# Patient Record
Sex: Female | Born: 1970 | Race: White | Hispanic: No | Marital: Single | Smoking: Former smoker
Health system: Southern US, Community
[De-identification: ages and names within clinical notes are randomized; demographics above are authoritative.]

## PROBLEM LIST (undated history)

## (undated) DIAGNOSIS — I10 Essential (primary) hypertension: Secondary | ICD-10-CM

## (undated) DIAGNOSIS — E785 Hyperlipidemia, unspecified: Secondary | ICD-10-CM

## (undated) DIAGNOSIS — K219 Gastro-esophageal reflux disease without esophagitis: Secondary | ICD-10-CM

## (undated) HISTORY — DX: Essential (primary) hypertension: I10

## (undated) HISTORY — DX: Gastro-esophageal reflux disease without esophagitis: K21.9

## (undated) HISTORY — DX: Hyperlipidemia, unspecified: E78.5

## (undated) HISTORY — PX: OTHER SURGICAL HISTORY: SHX169

---

## 2005-04-23 HISTORY — PX: HEMORROIDECTOMY: SUR656

## 2013-09-24 ENCOUNTER — Ambulatory Visit (INDEPENDENT_AMBULATORY_CARE_PROVIDER_SITE_OTHER): Payer: 59 | Admitting: Family

## 2013-09-24 ENCOUNTER — Encounter (INDEPENDENT_AMBULATORY_CARE_PROVIDER_SITE_OTHER): Payer: Self-pay

## 2013-09-24 ENCOUNTER — Encounter: Payer: Self-pay | Admitting: Family

## 2013-09-24 VITALS — BP 131/79 | HR 70 | Temp 98.6°F | Ht 66.0 in | Wt 259.0 lb

## 2013-09-24 DIAGNOSIS — Z13228 Encounter for screening for other metabolic disorders: Secondary | ICD-10-CM

## 2013-09-24 DIAGNOSIS — Z1329 Encounter for screening for other suspected endocrine disorder: Secondary | ICD-10-CM

## 2013-09-24 DIAGNOSIS — I10 Essential (primary) hypertension: Secondary | ICD-10-CM

## 2013-09-24 DIAGNOSIS — K219 Gastro-esophageal reflux disease without esophagitis: Secondary | ICD-10-CM | POA: Insufficient documentation

## 2013-09-24 DIAGNOSIS — E785 Hyperlipidemia, unspecified: Secondary | ICD-10-CM | POA: Insufficient documentation

## 2013-09-24 DIAGNOSIS — Z130 Encounter for screening for diseases of the blood and blood-forming organs and certain disorders involving the immune mechanism: Secondary | ICD-10-CM

## 2013-09-24 DIAGNOSIS — Z1321 Encounter for screening for nutritional disorder: Secondary | ICD-10-CM

## 2013-09-24 DIAGNOSIS — M25519 Pain in unspecified shoulder: Secondary | ICD-10-CM

## 2013-09-24 MED ORDER — PANTOPRAZOLE SODIUM 40 MG PO TBEC: 40.0000 mg | DELAYED_RELEASE_TABLET | Freq: Every day | ORAL | Status: DC

## 2013-09-24 MED ORDER — NAPROXEN 500 MG PO TABS: 500.0000 mg | ORAL_TABLET | Freq: Two times a day (BID) | ORAL | Status: DC

## 2013-09-24 MED ORDER — BUPROPION HCL ER (SR) 200 MG PO TB12: 200.0000 mg | ORAL_TABLET | Freq: Two times a day (BID) | ORAL | Status: DC

## 2013-09-24 MED ORDER — LOSARTAN POTASSIUM 50 MG PO TABS: 50.0000 mg | ORAL_TABLET | Freq: Every day | ORAL | Status: AC

## 2013-09-24 MED ORDER — LOVASTATIN 20 MG PO TABS: 20.0000 mg | ORAL_TABLET | Freq: Every day | ORAL | Status: AC

## 2013-09-24 NOTE — Progress Notes (Signed)
Subjective:    Patient ID: Penny Velasquez, female    DOB: [birth year], 43 y.o.   MRN: [id number]  Shoulder Pain  The pain is present in the right shoulder and left shoulder. The current episode started more than 1 month ago (About two months ago). There has been no history of extremity trauma. The problem occurs 2 to 4 times per day. The problem has been waxing and waning. The quality of the pain is described as aching. The pain is at a severity of 6/10. The pain is mild. Associated symptoms include numbness and tingling. Pertinent negatives include no inability to bear weight, joint swelling or limited range of motion. The symptoms are aggravated by activity. She has tried acetaminophen for the symptoms. The treatment provided mild relief. There is no history of diabetes, gout or osteoarthritis.  Hypertension This is a chronic problem. The current episode started more than 1 year ago. The problem has been resolved since onset. The problem is controlled. Pertinent negatives include no anxiety, blurred vision, chest pain, headaches, palpitations, peripheral edema or shortness of breath. Risk factors for coronary artery disease include dyslipidemia, sedentary lifestyle, obesity and family history. Past treatments include angiotensin blockers. The current treatment provides moderate improvement. There is no history of kidney disease, CAD/MI or a thyroid problem. There is no history of sleep apnea.  Hyperlipidemia This is a chronic problem. The current episode started more than 1 year ago. The problem is controlled. Recent lipid tests were reviewed and are normal. She has no history of diabetes or hypothyroidism. Pertinent negatives include no chest pain, leg pain, myalgias or shortness of breath. Current antihyperlipidemic treatment includes statins. The current treatment provides moderate improvement of lipids. Risk factors for coronary artery disease include dyslipidemia, family history, hypertension and  obesity.  Gastrophageal Reflux She reports no chest pain, no coughing, no heartburn or no nausea. This is a chronic problem. The current episode started more than 1 year ago. The problem occurs rarely. The problem has been resolved. The symptoms are aggravated by certain foods. Pertinent negatives include no fatigue. She has tried a PPI for the symptoms. The treatment provided significant relief.      Review of Systems  Constitutional: Negative for fatigue.  Eyes: Negative for blurred vision.  Respiratory: Negative.  Negative for apnea, cough and shortness of breath.   Cardiovascular: Negative for chest pain and palpitations.  Gastrointestinal: Negative for heartburn and nausea.  Genitourinary: Negative.   Musculoskeletal: Negative.  Negative for gout and myalgias.  Neurological: Positive for tingling and numbness. Negative for headaches.  All other systems reviewed and are negative.      Objective:   Physical Exam  Vitals reviewed. Constitutional: She is oriented to person, place, and time. She appears well-developed and well-nourished. No distress.  HENT:  Head: Normocephalic and atraumatic.  Right Ear: External ear normal.  Left Ear: External ear normal.  Mouth/Throat: Oropharynx is clear and moist.  Eyes: Pupils are equal, round, and reactive to light.  Neck: Normal range of motion. Neck supple. No thyromegaly present.  Cardiovascular: Normal rate, regular rhythm, normal heart sounds and intact distal pulses.   No murmur heard. Pulmonary/Chest: Effort normal and breath sounds normal. No respiratory distress. She has no wheezes.  Abdominal: Soft. Bowel sounds are normal. She exhibits no distension. There is no tenderness.  Musculoskeletal: Normal range of motion. She exhibits no edema and no tenderness.  Neurological: She is alert and oriented to person, place, and time. She has normal reflexes. No  cranial nerve deficit.  Skin: Skin is warm and dry.  Psychiatric: She has a  normal mood and affect. Her behavior is normal. Judgment and thought content normal.      BP 131/79  Pulse 70  Temp(Src) 98.6 F (37 C) (Oral)  Ht 5' 6"  (1.676 m)  Wt 259 lb (117.482 kg)  BMI 41.82 kg/m2  LMP 09/15/2013     Assessment & Plan:  1. Hyperlipidemia - lovastatin (MEVACOR) 20 MG tablet; Take 1 tablet (20 mg total) by mouth at bedtime.  Dispense: 30 tablet; Refill: 6 - Lipid panel  2. Hypertension - losartan (COZAAR) 50 MG tablet; Take 1 tablet (50 mg total) by mouth daily.  Dispense: 30 tablet; Refill: 6 - CMP14+EGFR  3. GERD (gastroesophageal reflux disease) - pantoprazole (PROTONIX) 40 MG tablet; Take 1 tablet (40 mg total) by mouth daily.  Dispense: 30 tablet; Refill: 6  4. Encounter for vitamin deficiency screening - Vit D  25 hydroxy (rtn osteoporosis monitoring)  5. Pain in joint, shoulder region -Take medication with food -Take continuous for next 5-7 days -rest -Ice/Heat if helps - naproxen (NAPROSYN) 500 MG tablet; Take 1 tablet (500 mg total) by mouth 2 (two) times daily with a meal.  Dispense: 30 tablet; Refill: 3   Continue all meds Labs pending Health Maintenance reviewed Diet and exercise encouraged RTO 6 months  Evelina Dun, FNP   Evelina Dun, FNP

## 2013-09-24 NOTE — Patient Instructions (Signed)
   Shoulder Pain The shoulder is the joint that connects your arms to your body. The bones that form the shoulder joint include the upper arm bone (humerus), the shoulder blade (scapula), and the collarbone (clavicle). The top of the humerus is shaped like a ball and fits into a rather flat socket on the scapula (glenoid cavity). A combination of muscles and strong, fibrous tissues that connect muscles to bones (tendons) support your shoulder joint and hold the ball in the socket. Small, fluid-filled sacs (bursae) are located in different areas of the joint. They act as cushions between the bones and the overlying soft tissues and help reduce friction between the gliding tendons and the bone as you move your arm. Your shoulder joint allows a wide range of motion in your arm. This range of motion allows you to do things like scratch your back or throw a ball. However, this range of motion also makes your shoulder more prone to pain from overuse and injury. Causes of shoulder pain can originate from both injury and overuse and usually can be grouped in the following four categories:  Redness, swelling, and pain (inflammation) of the tendon (tendinitis) or the bursae (bursitis).  Instability, such as a dislocation of the joint.  Inflammation of the joint (arthritis).  Broken bone (fracture). HOME CARE INSTRUCTIONS   Apply ice to the sore area.  Put ice in a plastic bag.  Place a towel between your skin and the bag.  Leave the ice on for 15-20 minutes, 03-04 times per day for the first 2 days.  Stop using cold packs if they do not help with the pain.  If you have a shoulder sling or immobilizer, wear it as long as your caregiver instructs. Only remove it to shower or bathe. Move your arm as little as possible, but keep your hand moving to prevent swelling.  Squeeze a soft ball or foam pad as much as possible to help prevent swelling.  Only take over-the-counter or prescription medicines for  pain, discomfort, or fever as directed by your caregiver. SEEK MEDICAL CARE IF:   Your shoulder pain increases, or new pain develops in your arm, hand, or fingers.  Your hand or fingers become cold and numb.  Your pain is not relieved with medicines. SEEK IMMEDIATE MEDICAL CARE IF:   Your arm, hand, or fingers are numb or tingling.  Your arm, hand, or fingers are significantly swollen or turn white or blue. MAKE SURE YOU:   Understand these instructions.  Will watch your condition.  Will get help right away if you are not doing well or get worse. Document Released: 01/17/2005 Document Revised: 01/02/2012 Document Reviewed: 03/24/2011 Exit[employer] Patient Information 2014 [employer].  

## 2013-09-25 ENCOUNTER — Other Ambulatory Visit: Payer: Self-pay | Admitting: Family

## 2013-09-25 ENCOUNTER — Telehealth: Payer: Self-pay | Admitting: Family Medicine

## 2013-09-25 LAB — CMP14+EGFR
ALK PHOS: 74 IU/L (ref 39–117)
ALT: 20 IU/L (ref 0–32)
AST: 14 IU/L (ref 0–40)
Albumin/Globulin Ratio: 2.3 (ref 1.1–2.5)
Albumin: 4.2 g/dL (ref 3.5–5.5)
BILIRUBIN TOTAL: 0.4 mg/dL (ref 0.0–1.2)
BUN/Creatinine Ratio: 10 (ref 9–23)
BUN: 10 mg/dL (ref 6–24)
CHLORIDE: 101 mmol/L (ref 97–108)
CO2: 25 mmol/L (ref 18–29)
Calcium: 9.3 mg/dL (ref 8.7–10.2)
Creatinine, Ser: 1.02 mg/dL — ABNORMAL HIGH (ref 0.57–1.00)
GFR calc Af Amer: 79 mL/min/{1.73_m2} (ref >59)
GFR calc non Af Amer: 68 mL/min/{1.73_m2} (ref >59)
Globulin, Total: 1.8 g/dL (ref 1.5–4.5)
Glucose: 89 mg/dL (ref 65–99)
POTASSIUM: 4.4 mmol/L (ref 3.5–5.2)
Sodium: 139 mmol/L (ref 134–144)
Total Protein: 6 g/dL (ref 6.0–8.5)

## 2013-09-25 LAB — LIPID PANEL
CHOLESTEROL TOTAL: 180 mg/dL (ref 100–199)
Chol/HDL Ratio: 4 ratio units (ref 0.0–4.4)
HDL: 45 mg/dL (ref >39)
LDL CALC: 95 mg/dL (ref 0–99)
Triglycerides: 198 mg/dL — ABNORMAL HIGH (ref 0–149)
VLDL Cholesterol Cal: 40 mg/dL (ref 5–40)

## 2013-09-25 LAB — VITAMIN D 25 HYDROXY (VIT D DEFICIENCY, FRACTURES): VIT D 25 HYDROXY: 20.2 ng/mL — AB (ref 30.0–100.0)

## 2013-09-25 NOTE — Telephone Encounter (Signed)
Patient aware.

## 2013-09-25 NOTE — Telephone Encounter (Signed)
Message copied by Azalee Course on Fri Sep 25, 2013 11:16 AM ------      Message from: [place], [place] A      Created: Fri Sep 25, 2013  9:57 AM       Kidney and liver function stable      Cholesterol levels WNL- Triglycerides are high- Need to be on low fat diet-If they stay elevated will need to increase medication      Vit D levels low- Need to start on Vit D OTC ------

## 2013-10-09 ENCOUNTER — Telehealth: Payer: Self-pay | Admitting: Family

## 2013-10-09 DIAGNOSIS — K219 Gastro-esophageal reflux disease without esophagitis: Secondary | ICD-10-CM

## 2013-10-12 MED ORDER — PANTOPRAZOLE SODIUM 40 MG PO TBEC: 40.0000 mg | DELAYED_RELEASE_TABLET | Freq: Every day | ORAL | Status: DC

## 2013-10-12 MED ORDER — MELOXICAM 15 MG PO TABS: 15.0000 mg | ORAL_TABLET | Freq: Every day | ORAL | Status: DC

## 2013-10-12 NOTE — Telephone Encounter (Signed)
[facility]. Stop taking naproxen and new rx sent of mobic 15 mg daily. If does not help will need to be reseen.

## 2013-10-13 NOTE — Telephone Encounter (Signed)
Patient aware.

## 2013-11-25 ENCOUNTER — Encounter: Payer: Self-pay | Admitting: Family Medicine

## 2013-11-25 ENCOUNTER — Ambulatory Visit (INDEPENDENT_AMBULATORY_CARE_PROVIDER_SITE_OTHER): Payer: 59 | Admitting: Family Medicine

## 2013-11-25 VITALS — BP 127/88 | HR 74 | Temp 98.4°F | Ht 66.0 in | Wt 263.0 lb

## 2013-11-25 DIAGNOSIS — R6884 Jaw pain: Secondary | ICD-10-CM

## 2013-11-25 DIAGNOSIS — N912 Amenorrhea, unspecified: Secondary | ICD-10-CM

## 2013-11-25 MED ORDER — AZITHROMYCIN 250 MG PO TABS: ORAL_TABLET | ORAL | Status: AC

## 2013-11-25 MED ORDER — LABETALOL HCL 100 MG PO TABS: 100.0000 mg | ORAL_TABLET | Freq: Two times a day (BID) | ORAL | Status: DC

## 2013-11-25 MED ORDER — OXYCODONE HCL 5 MG PO TABS: 5.0000 mg | ORAL_TABLET | ORAL | Status: AC | PRN

## 2013-11-25 NOTE — Progress Notes (Signed)
   Subjective:    Patient ID: Penny Velasquez, female    DOB: [birth year], 43 y.o.   MRN: [id number]  HPI 43 year old female with left lower jaw pain. [facility] and given hydrocodone. She had a negative urine pregnancy test but she is trying to conceive. Of note she is on several other medicines including Wellbutrin, Losartin, and pantoprazole. No x-rays were done because of the possibility of pregnancy. There is a history of ovulation 8 days ago.    Review of Systems  Constitutional: Negative.   HENT: Negative.   Eyes: Negative.   Respiratory: Negative.   Cardiovascular: Negative.   Gastrointestinal: Negative.   Endocrine: Negative.   Genitourinary: Negative.   Musculoskeletal: Positive for myalgias.       Bilateral thumb pain  Skin: Rash: not true rash but healing intertrigo.  Hematological: Negative.   Psychiatric/Behavioral: Negative.        Objective:   Physical Exam  Constitutional: She is oriented to person, place, and time. She appears well-developed and well-nourished.  Pain in left side mandible with pain on persussion of 1or 2nd molar  Eyes: Conjunctivae and EOM are normal.  Neck: Normal range of motion. Neck supple.  Cardiovascular: Normal rate, regular rhythm and normal heart sounds.   Pulmonary/Chest: Effort normal and breath sounds normal.  Abdominal: Soft. Bowel sounds are normal.  Musculoskeletal: Normal range of motion.  Neurological: She is alert and oriented to person, place, and time. She has normal reflexes.  Skin: Skin is warm and dry.  Psychiatric: She has a normal mood and affect. Her behavior is normal. Thought content normal.          Assessment & Plan:  1. Pain in bone of jaw Likely tooth abscess.  Will get serum preg, qualitative and if negative, x-ray  2. Amenorrhea As above.  After review of her meds will change due to desire to get pregnant.  D/C Losartan and begin labetalol.  Add Zithromax for  possible abcess.  Taper wellbutrin.  Also change oxycodone for hydrocodone due to class b vs c in pregnancy  Frederica KusterStephen M Miller MD - hCG, serum, qualitative

## 2013-11-26 ENCOUNTER — Telehealth: Payer: Self-pay | Admitting: Family Medicine

## 2013-11-26 LAB — HCG, SERUM, QUALITATIVE: hCG,Beta Subunit,Qual,Serum: NEGATIVE m[IU]/mL (ref <6)

## 2013-11-26 NOTE — Telephone Encounter (Signed)
Advised that she should continue the antibiotics and follow up with a dentist. She is unable to pay for a dentist until next week.  Explained that we are very limited in what we can do to treat dental problems. She is reluctant to take any anti-inflammatories since she is trying to conceive. She is taking the pain medication but states it isn't helping a lot. Advised to apply ice to help with swelling and that I will forward this message to Dr. Hyacinth MeekerMiller for review. She is aware that he is out of the office until tomorrow and that she may not receive a call back until then.  Once again strongly encouraged her to contact a dentist about this problem. Also explained that a change in antibiotics would not produce an immediate effect.

## 2013-11-27 ENCOUNTER — Telehealth: Payer: Self-pay | Admitting: Family Medicine

## 2013-11-27 NOTE — Telephone Encounter (Signed)
Message copied by Azalee CourseFULP, Jayton Popelka on Fri Nov 27, 2013  9:22 AM ------      Message from: Frederica KusterMILLER, STEPHEN M      Created: Fri Nov 27, 2013  7:56 AM       Pt is not pregnant ------

## 2013-11-27 NOTE — Telephone Encounter (Signed)
Agree with advice by Baxter HireKristen; nothing stronger for pain and continue zithromax

## 2013-12-03 ENCOUNTER — Encounter: Payer: Self-pay | Admitting: *Deleted

## 2014-01-07 ENCOUNTER — Telehealth: Payer: Self-pay | Admitting: Family Medicine

## 2014-01-07 MED ORDER — LABETALOL HCL 100 MG PO TABS: 100.0000 mg | ORAL_TABLET | Freq: Two times a day (BID) | ORAL | Status: DC

## 2014-01-08 ENCOUNTER — Telehealth: Payer: Self-pay | Admitting: Family Medicine

## 2014-02-25 ENCOUNTER — Other Ambulatory Visit: Payer: Self-pay | Admitting: Family

## 2014-03-24 ENCOUNTER — Telehealth: Payer: Self-pay | Admitting: Family Medicine

## 2014-03-24 NOTE — Telephone Encounter (Signed)
Patient offered a 4:30 appointment but she was unable to take that appointment.

## 2014-03-24 NOTE — Telephone Encounter (Signed)
Patient aware we have no available appointments today and offered her an appointment for tomorrow but patient could not come tomorrow.

## 2014-03-29 ENCOUNTER — Ambulatory Visit: Payer: 59 | Admitting: Family

## 2014-03-30 ENCOUNTER — Ambulatory Visit: Payer: 59 | Admitting: Family Medicine

## 2014-04-30 ENCOUNTER — Other Ambulatory Visit: Payer: Self-pay | Admitting: Family

## 2014-05-21 ENCOUNTER — Ambulatory Visit: Payer: 59 | Admitting: Family

## 2014-12-18 ENCOUNTER — Encounter (HOSPITAL_COMMUNITY): Payer: Self-pay | Admitting: Emergency Medicine

## 2014-12-18 ENCOUNTER — Emergency Department (HOSPITAL_COMMUNITY): Payer: BLUE CROSS/BLUE SHIELD

## 2014-12-18 ENCOUNTER — Emergency Department (HOSPITAL_COMMUNITY)
Admission: EM | Admit: 2014-12-18 | Discharge: 2014-12-18 | Disposition: A | Discharge status: Home / Self Care | Payer: BLUE CROSS/BLUE SHIELD | Attending: Emergency Medicine | Admitting: Emergency Medicine

## 2014-12-18 DIAGNOSIS — R079 Chest pain, unspecified: Secondary | ICD-10-CM | POA: Insufficient documentation

## 2014-12-18 DIAGNOSIS — K219 Gastro-esophageal reflux disease without esophagitis: Secondary | ICD-10-CM | POA: Diagnosis not present

## 2014-12-18 DIAGNOSIS — Z880 Allergy status to penicillin: Secondary | ICD-10-CM | POA: Insufficient documentation

## 2014-12-18 DIAGNOSIS — I10 Essential (primary) hypertension: Secondary | ICD-10-CM | POA: Diagnosis not present

## 2014-12-18 DIAGNOSIS — E785 Hyperlipidemia, unspecified: Secondary | ICD-10-CM | POA: Insufficient documentation

## 2014-12-18 DIAGNOSIS — Z79899 Other long term (current) drug therapy: Secondary | ICD-10-CM | POA: Diagnosis not present

## 2014-12-18 LAB — CBC
HEMATOCRIT: 37 % (ref 36.0–46.0)
Hemoglobin: 13 g/dL (ref 12.0–15.0)
MCH: 32.2 pg (ref 26.0–34.0)
MCHC: 35.1 g/dL (ref 30.0–36.0)
MCV: 91.6 fL (ref 78.0–100.0)
Platelets: 333 10*3/uL (ref 150–400)
RBC: 4.04 MIL/uL (ref 3.87–5.11)
RDW: 12.2 % (ref 11.5–15.5)
WBC: 9.2 10*3/uL (ref 4.0–10.5)

## 2014-12-18 LAB — COMPREHENSIVE METABOLIC PANEL
ALBUMIN: 3.8 g/dL (ref 3.5–5.0)
ALT: 24 U/L (ref 14–54)
AST: 19 U/L (ref 15–41)
Alkaline Phosphatase: 72 U/L (ref 38–126)
Anion gap: 6 (ref 5–15)
BILIRUBIN TOTAL: 0.4 mg/dL (ref 0.3–1.2)
BUN: 9 mg/dL (ref 6–20)
CHLORIDE: 103 mmol/L (ref 101–111)
CO2: 27 mmol/L (ref 22–32)
Calcium: 9.1 mg/dL (ref 8.9–10.3)
Creatinine, Ser: 0.92 mg/dL (ref 0.44–1.00)
GFR calc Af Amer: >60 mL/min (ref >60)
GFR calc non Af Amer: >60 mL/min (ref >60)
GLUCOSE: 88 mg/dL (ref 65–99)
POTASSIUM: 4 mmol/L (ref 3.5–5.1)
SODIUM: 136 mmol/L (ref 135–145)
Total Protein: 6.6 g/dL (ref 6.5–8.1)

## 2014-12-18 LAB — TROPONIN I

## 2014-12-18 NOTE — Discharge Instructions (Signed)
Chest Pain (Nonspecific) °It is often hard to give a specific diagnosis for the cause of chest pain. There is always a chance that your pain could be related to something serious, such as a heart attack or a blood clot in the lungs. You need to follow up with your health care provider for further evaluation. °CAUSES  °· Heartburn. °· Pneumonia or bronchitis. °· Anxiety or stress. °· Inflammation around your heart (pericarditis) or lung (pleuritis or pleurisy). °· A blood clot in the lung. °· A collapsed lung (pneumothorax). It can develop suddenly on its own (spontaneous pneumothorax) or from trauma to the chest. °· Shingles infection (herpes zoster virus). °The chest wall is composed of bones, muscles, and cartilage. Any of these can be the source of the pain. °· The bones can be bruised by injury. °· The muscles or cartilage can be strained by coughing or overwork. °· The cartilage can be affected by inflammation and become sore (costochondritis). °DIAGNOSIS  °Lab tests or other studies may be needed to find the cause of your pain. Your health care provider may have you take a test called an ambulatory electrocardiogram (ECG). An ECG records your heartbeat patterns over a 24-hour period. You may also have other tests, such as: °· Transthoracic echocardiogram (TTE). During echocardiography, sound waves are used to evaluate how blood flows through your heart. °· Transesophageal echocardiogram (TEE). °· Cardiac monitoring. This allows your health care provider to monitor your heart rate and rhythm in real time. °· Holter monitor. This is a portable device that records your heartbeat and can help diagnose heart arrhythmias. It allows your health care provider to track your heart activity for several days, if needed. °· Stress tests by exercise or by giving medicine that makes the heart beat faster. °TREATMENT  °· Treatment depends on what may be causing your chest pain. Treatment may include: °· Acid blockers for  heartburn. °· Anti-inflammatory medicine. °· Pain medicine for inflammatory conditions. °· Antibiotics if an infection is present. °· You may be advised to change lifestyle habits. This includes stopping smoking and avoiding alcohol, caffeine, and chocolate. °· You may be advised to keep your head raised (elevated) when sleeping. This reduces the chance of acid going backward from your stomach into your esophagus. °Most of the time, nonspecific chest pain will improve within 2-3 days with rest and mild pain medicine.  °HOME CARE INSTRUCTIONS  °· If antibiotics were prescribed, take them as directed. Finish them even if you start to feel better. °· For the next few days, avoid physical activities that bring on chest pain. Continue physical activities as directed. °· Do not use any tobacco products, including cigarettes, chewing tobacco, or electronic cigarettes. °· Avoid drinking alcohol. °· Only take medicine as directed by your health care provider. °· Follow your health care provider's suggestions for further testing if your chest pain does not go away. °· Keep any follow-up appointments you made. If you do not go to an appointment, you could develop lasting (chronic) problems with pain. If there is any problem keeping an appointment, call to reschedule. °SEEK MEDICAL CARE IF:  °· Your chest pain does not go away, even after treatment. °· You have a rash with blisters on your chest. °· You have a fever. °SEEK IMMEDIATE MEDICAL CARE IF:  °· You have increased chest pain or pain that spreads to your arm, neck, jaw, back, or abdomen. °· You have shortness of breath. °· You have an increasing cough, or you cough   up blood. °· You have severe back or abdominal pain. °· You feel nauseous or vomit. °· You have severe weakness. °· You faint. °· You have chills. °This is an emergency. Do not wait to see if the pain will go away. Get medical help at once. Call your local emergency services (911 in [place] Do not drive  yourself to the hospital. °MAKE SURE YOU:  °· Understand these instructions. °· Will watch your condition. °· Will get help right away if you are not doing well or get worse. °Document Released: 01/17/2005 Document Revised: 04/14/2013 Document Reviewed: 11/13/2007 °ExitCare® Patient Information ©2015 [employer]. This information is not intended to replace advice given to you by your health care provider. Make sure you discuss any questions you have with your health care provider. ° °Hypertension °Hypertension, commonly called high blood pressure, is when the force of blood pumping through your arteries is too strong. Your arteries are the blood vessels that carry blood from your heart throughout your body. A blood pressure reading consists of a higher number over a lower number, such as 110/72. The higher number (systolic) is the pressure inside your arteries when your heart pumps. The lower number (diastolic) is the pressure inside your arteries when your heart relaxes. Ideally you want your blood pressure below 120/80. °Hypertension forces your heart to work harder to pump blood. Your arteries may become narrow or stiff. Having hypertension puts you at risk for heart disease, stroke, and other problems.  °RISK FACTORS °Some risk factors for high blood pressure are controllable. Others are not.  °Risk factors you cannot control include:  °· Race. You may be at higher risk if you are African American. °· Age. Risk increases with age. °· Gender. Men are at higher risk than women before age 45 years. After age 65, women are at higher risk than men. °Risk factors you can control include: °· Not getting enough exercise or physical activity. °· Being overweight. °· Getting too much fat, sugar, calories, or salt in your diet. °· Drinking too much alcohol. °SIGNS AND SYMPTOMS °Hypertension does not usually cause signs or symptoms. Extremely high blood pressure (hypertensive crisis) may cause headache, anxiety, shortness  of breath, and nosebleed. °DIAGNOSIS  °To check if you have hypertension, your health care provider will measure your blood pressure while you are seated, with your arm held at the level of your heart. It should be measured at least twice using the same arm. Certain conditions can cause a difference in blood pressure between your right and left arms. A blood pressure reading that is higher than normal on one occasion does not mean that you need treatment. If one blood pressure reading is high, ask your health care provider about having it checked again. °TREATMENT  °Treating high blood pressure includes making lifestyle changes and possibly taking medicine. Living a healthy lifestyle can help lower high blood pressure. You may need to change some of your habits. °Lifestyle changes may include: °· Following the DASH diet. This diet is high in fruits, vegetables, and whole grains. It is low in salt, red meat, and added sugars. °· Getting at least 2½ hours of brisk physical activity every week. °· Losing weight if necessary. °· Not smoking. °· Limiting alcoholic beverages. °· Learning ways to reduce stress. ° If lifestyle changes are not enough to get your blood pressure under control, your health care provider may prescribe medicine. You may need to take more than one. Work closely with your health care provider   to understand the risks and benefits. °HOME CARE INSTRUCTIONS °· Have your blood pressure rechecked as directed by your health care provider.   °· Take medicines only as directed by your health care provider. Follow the directions carefully. Blood pressure medicines must be taken as prescribed. The medicine does not work as well when you skip doses. Skipping doses also puts you at risk for problems.   °· Do not smoke.   °· Monitor your blood pressure at home as directed by your health care provider.  °SEEK MEDICAL CARE IF:  °· You think you are having a reaction to medicines taken. °· You have recurrent  headaches or feel dizzy. °· You have swelling in your ankles. °· You have trouble with your vision. °SEEK IMMEDIATE MEDICAL CARE IF: °· You develop a severe headache or confusion. °· You have unusual weakness, numbness, or feel faint. °· You have severe chest or abdominal pain. °· You vomit repeatedly. °· You have trouble breathing. °MAKE SURE YOU:  °· Understand these instructions. °· Will watch your condition. °· Will get help right away if you are not doing well or get worse. °Document Released: 04/09/2005 Document Revised: 08/24/2013 Document Reviewed: 01/30/2013 °ExitCare® Patient Information ©2015 [employer]. This information is not intended to replace advice given to you by your health care provider. Make sure you discuss any questions you have with your health care provider. ° °

## 2014-12-18 NOTE — ED Provider Notes (Signed)
CSN: [id number]     Arrival date & time 12/18/14  1912 History   First MD Initiated Contact with Patient 12/18/14 1944     Chief Complaint  Patient presents with  . Chest Pain     (Consider location/radiation/quality/duration/timing/severity/associated sxs/prior Treatment) HPI Comments: Patient presents to the ER for evaluation of chest pain and elevated blood pressure. Patient reports that she was experiencing some pressure in the left side of her chest earlier today. This has resolved. She checked her blood pressure because she recently had stopped taking her labetalol, blood pressure was markedly elevated. She presented to the ER for evaluation. There is no headache or blurred vision. No shortness of breath.  Patient is a 44 y.o. female presenting with chest pain.  Chest Pain   Past Medical History  Diagnosis Date  . GERD (gastroesophageal reflux disease)   . Hyperlipidemia   . Hypertension    Past Surgical History  Procedure Laterality Date  . Hemorroidectomy  2007  . Laprascopic abdominal surgery      exploratory   Family History  Problem Relation Age of Onset  . Anemia Mother   . Hypertension Mother   . Hypertension Father   . Heart disease Father   . Cancer Father     prostate   Social History  Substance Use Topics  . Smoking status: Former Smoker    Types: Cigarettes    Quit date: 09/25/2006  . Smokeless tobacco: Never Used  . Alcohol Use: No   OB History    No data available     Review of Systems  Cardiovascular: Positive for chest pain.  All other systems reviewed and are negative.     Allergies  Penicillins  Home Medications   Prior to Admission medications   Medication Sig Start Date End Date Taking? Authorizing Provider  azithromycin (ZITHROMAX) 250 MG tablet Take two tabs 1st day, then one q day x 4 11/25/13   Frederica Kuster, MD  buPROPion [facility] SR) 200 MG 12 hr tablet Take 1 tablet (200 mg total) by mouth 2 (two) times daily. 09/24/13    Junie Spencer, FNP  labetalol (NORMODYNE) 100 MG tablet TAKE ONE TABLET BY MOUTH TWICE DAILY 02/26/14   Frederica Kuster, MD  losartan (COZAAR) 50 MG tablet Take 1 tablet (50 mg total) by mouth daily. 09/24/13   Junie Spencer, FNP  lovastatin (MEVACOR) 20 MG tablet Take 1 tablet (20 mg total) by mouth at bedtime. 09/24/13   Junie Spencer, FNP  Omega-3 Fatty Acids (FISH OIL CONCENTRATE PO) Take by mouth.    Historical Provider, MD  oxyCODONE (OXY IR/ROXICODONE) 5 MG immediate release tablet Take 1 tablet (5 mg total) by mouth every 4 (four) hours as needed for severe pain. 11/25/13   Frederica Kuster, MD  pantoprazole (PROTONIX) 40 MG tablet TAKE ONE TABLET BY MOUTH ONCE DAILY 05/03/14   Frederica Kuster, MD  Prenatal Multivit-Min-Fe-FA (PRENATAL VITAMINS PO) Take by mouth.    Historical Provider, MD   BP 163/100 mmHg  Pulse 59  Temp(Src) 98.1 F (36.7 C) (Oral)  Resp 20  Ht 5' 6.75" (1.695 m)  Wt 250 lb (113.399 kg)  BMI 39.47 kg/m2  SpO2 99%  LMP 12/06/2014 Physical Exam  Constitutional: She is oriented to person, place, and time. She appears well-developed and well-nourished. No distress.  HENT:  Head: Normocephalic and atraumatic.  Right Ear: Hearing normal.  Left Ear: Hearing normal.  Nose: Nose normal.  Mouth/Throat: Oropharynx  is clear and moist and mucous membranes are normal.  Eyes: Conjunctivae and EOM are normal. Pupils are equal, round, and reactive to light.  Neck: Normal range of motion. Neck supple.  Cardiovascular: Regular rhythm, S1 normal and S2 normal.  Exam reveals no gallop and no friction rub.   No murmur heard. Pulmonary/Chest: Effort normal and breath sounds normal. No respiratory distress. She exhibits no tenderness.  Abdominal: Soft. Normal appearance and bowel sounds are normal. There is no hepatosplenomegaly. There is no tenderness. There is no rebound, no guarding, no tenderness at McBurney's point and negative Murphy's sign. No hernia.  Musculoskeletal:  Normal range of motion.  Neurological: She is alert and oriented to person, place, and time. She has normal strength. No cranial nerve deficit or sensory deficit. Coordination normal. GCS eye subscore is 4. GCS verbal subscore is 5. GCS motor subscore is 6.  Skin: Skin is warm, dry and intact. No rash noted. No cyanosis.  Psychiatric: She has a normal mood and affect. Her speech is normal and behavior is normal. Thought content normal.  Nursing note and vitals reviewed.   ED Course  Procedures (including critical care time) Labs Review Labs Reviewed  CBC  COMPREHENSIVE METABOLIC PANEL  TROPONIN I    Imaging Review No results found. I have personally reviewed and evaluated these images and lab results as part of my medical decision-making.   EKG Interpretation   Date/Time:  Saturday December 18 2014 19:33:10 EDT Ventricular Rate:  60 PR Interval:  151 QRS Duration: 81 QT Interval:  412 QTC Calculation: 412 R Axis:   27 Text Interpretation:  Sinus rhythm Low voltage, precordial leads No  previous tracing Confirmed by POLLINA  MD, CHRISTOPHER ([id number]) on  12/18/2014 8:01:39 PM      MDM   Final diagnoses:  None  hypertension  Patient presents to the ER for evaluation of chest pain and hypertension. Patient had a brief episode of tightness in her chest that spontaneously resolved. She checked her blood pressure and it was elevated. She checked it again it was even higher. She did present to the ER with a blood pressure of 210/87. It has slowly come down and she has now essentially normotensive. Patient has been off of her labetalol but recently restarted. It is recommended she takes her clonidine and labetalol for several weeks before she makes any decisions on blood pressure medication changes. Her EKG and troponin are negative. Patient is felt to be low risk, discharged outpatient management.    Gilda Crease, MD 12/18/14 [phone]

## 2016-05-22 ENCOUNTER — Ambulatory Visit: Payer: BLUE CROSS/BLUE SHIELD | Attending: Family Medicine | Admitting: Physical Therapy

## 2016-05-22 DIAGNOSIS — M5441 Lumbago with sciatica, right side: Secondary | ICD-10-CM | POA: Insufficient documentation

## 2016-05-22 NOTE — Patient Instructions (Signed)
Trigger Point Dry Needling  . What is Trigger Point Dry Needling (DN)? o DN is a physical therapy technique used to treat muscle pain and dysfunction. Specifically, DN helps deactivate muscle trigger points (muscle knots).  o A thin filiform needle is used to penetrate the skin and stimulate the underlying trigger point. The goal is for a local twitch response (LTR) to occur and for the trigger point to relax. No medication of any kind is injected during the procedure.   . What Does Trigger Point Dry Needling Feel Like?  o The procedure feels different for each individual patient. Some patients report that they do not actually feel the needle enter the skin and overall the process is not painful. Very mild bleeding may occur. However, many patients feel a deep cramping in the muscle in which the needle was inserted. This is the local twitch response.   Marland Kitchen. How Will I feel after the treatment? o Soreness is normal, and the onset of soreness may not occur for a few hours. Typically this soreness does not last longer than two days.  o Bruising is uncommon, however; ice can be used to decrease any possible bruising.  o In rare cases feeling tired or nauseous after the treatment is normal. In addition, your symptoms may get worse before they get better, this period will typically not last longer than 24 hours.   . What Can I do After My Treatment? o Increase your hydration by drinking more water for the next 24 hours. o You may place ice or heat on the areas treated that have become sore, however, do not use heat on inflamed or bruised areas. Heat often brings more relief post needling. o You can continue your regular activities, but vigorous activity is not recommended initially after the treatment for 24 hours. o DN is best combined with other physical therapy such as strengthening, stretching, and other therapies.    Precautions:  In some cases, dry needling is done over the lung field. While rare,  there is a risk of pneumothorax (punctured lung). Because of this, if you ever experience shortness of breath on exertion, difficulty taking a deep breath, chest pain or a dry cough following dry needling, you should report to an emergency room and tell them that you have been dry needled over the thorax.  Solon PalmJulie Elsie Baynes, PT 05/22/16 1:29 PM [facility] [address] [place], [place], [address] Phone: [phone]   Fax:  [phone]

## 2016-05-22 NOTE — Therapy (Signed)
[facility] [address] [place], [place], [address] Phone: [phone]   Fax:  [phone]  Physical Therapy Evaluation  Patient Details  Name: Penny Velasquez MRN: [id number] Date of Birth: [birth year] Referring Provider: Kathlen Brunswick  Encounter Date: 05/22/2016      PT End of Session - 05/22/16 1305    Visit Number 1   Number of Visits 12   Date for PT Re-Evaluation 07/03/16   PT Start Time 1305   PT Stop Time 1356   PT Time Calculation (min) 51 min   Activity Tolerance Patient tolerated treatment well   Behavior During Therapy [facility] for tasks assessed/performed      Past Medical History:  Diagnosis Date  . GERD (gastroesophageal reflux disease)   . Hyperlipidemia   . Hypertension     Past Surgical History:  Procedure Laterality Date  . HEMORROIDECTOMY  2007  . Laprascopic abdominal surgery     exploratory    There were no vitals filed for this visit.       Subjective Assessment - 05/22/16 1301    Subjective Patint has a h/o LBP since 2004 when she hurt her back at work. Her most recent episode began 05/07/16. She has intermittent pain in R low back to ankle. Sometimes feels like R hip is going to dislocate when walking. Patient denies N/T.   Pertinent History HTN, twisted sacrum. ALLERGY: PENICILLEN   Limitations Sitting;Standing   How long can you sit comfortably? 30 min   How long can you stand comfortably? 20 min   Diagnostic tests xray shows lumbar arthritis   Patient Stated Goals decrease pain and increase movement   Currently in Pain? Yes   Pain Score 6    Pain Location Back   Pain Orientation Right;Lower   Pain Descriptors / Indicators Aching;Burning   Pain Type Acute pain   Pain Radiating Towards to R ankle   Pain Onset 1 to 4 weeks ago   Pain Frequency Intermittent   Aggravating Factors  prolonged walking and bending   Pain Relieving Factors rest, heat, NSAID   Effect of Pain on Daily Activities  limited with sitting and standing            [facility] PT Assessment - 05/22/16 0001      Assessment   Medical Diagnosis Acute right sided LBP with sciatica   Referring Provider Kathlen Brunswick   Onset Date/Surgical Date 05/07/16   Next MD Visit 05/24/16     Precautions   Precautions None     Balance Screen   Has the patient fallen in the past 6 months No   Has the patient had a decrease in activity level because of a fear of falling?  No   Is the patient reluctant to leave their home because of a fear of falling?  No     Prior Function   Level of Independence Independent   Vocation On disability     Posture/Postural Control   Posture Comments tight R QL, decreased lumbar lordosis     ROM / Strength   AROM / PROM / Strength AROM;Strength     AROM   Overall AROM Comments Lumbar and B hips WNL     Strength   Overall Strength Comments R hip flex 4+/5, else B hips 5/5; R knee ext 4/5; else B knees 5/5.      Flexibility   Soft Tissue Assessment /Muscle Length --  tight R gluteals, PF     Palpation  Spinal mobility WNL   Palpation comment even pelvic landmarks; multiple TPs in R gluteals and piriformis     Special Tests    Special Tests --  Negative SLR B; negative SIJ compression and instability                   [facility] Adult PT Treatment/Exercise - 05/22/16 0001      Manual Therapy   Manual Therapy Soft tissue mobilization   Soft tissue mobilization to R gluteals/piriformis and R lumbar          Trigger Point Dry Needling - 05/22/16 1425    Consent Given? Yes   Education Handout Provided Yes   Muscles Treated Upper Body Quadratus Lumborum   Muscles Treated Lower Body Gluteus minimus;Gluteus maximus  R glut med (not max)   Gluteus Maximus Response Twitch response elicited;Palpable increased muscle length   Gluteus Minimus Response Twitch response elicited;Palpable increased muscle length              PT Education - 05/22/16 1424     Education provided Yes   Education Details HEP; dry needling education and aftercare.   Person(s) Educated Patient   Methods Explanation;Handout   Comprehension Verbalized understanding             PT Long Term Goals - 05/22/16 1439      PT LONG TERM GOAL #1   Title I with HEP   Baseline     Time 6   Period Weeks   Status New     PT LONG TERM GOAL #2   Title Patient to be able to perform ADLS with 1-2/10 pain or less.   Time 6   Period Weeks   Status New     PT LONG TERM GOAL #3   Title Patient able to sit > 30 min without having to change positions.   Time 6   Period Weeks   Status New     PT LONG TERM GOAL #4   Title Patient to demo R hip flex and knee ext strength of 5/5 to improve function.   Time 6   Period Weeks   Status New               Plan - 05/22/16 1427    Clinical Impression Statement Patient presents with acute LBP on R side with intermittent referral to R ant/lat ankle. She demos some weakness in her R hip and knee and has multiple trigger points in gluteals and hip rotators. She also demonstrates a tight R QL. She reports some pain in R low back with LLE movement in supine indicating weak stabilizing muscles. Dry needling was performed to address TPs with localized twitch responses elicited especially in gluteals. However, patient did not like the treatment, so PT stopped. Normal response to modalities.   Rehab Potential Excellent   PT Frequency 2x / week   PT Duration 6 weeks   PT Treatment/Interventions ADLs/Self Care Home Management;Cryotherapy;Electrical Stimulation;Moist Heat;Ultrasound;Patient/family education;Neuromuscular re-education;Therapeutic exercise;Therapeutic activities;Manual techniques;Dry needling   PT Next Visit Plan Assess DN; STW/TPR to R gluteals and lumbar; core stabilization exercises. Modalities prn.   PT Home Exercise Plan [facility], [facility]      Patient will benefit from skilled therapeutic intervention in order to  improve the following deficits and impairments:  Pain, Impaired flexibility, Decreased strength  Visit Diagnosis: Acute right-sided low back pain with right-sided sciatica - Plan: PT plan of care cert/re-cert     Problem List Patient Active  Problem List   Diagnosis Date Noted  . Pain in bone of jaw 11/25/2013  . Hyperlipidemia 09/24/2013  . Hypertension 09/24/2013  . GERD (gastroesophageal reflux disease) 09/24/2013   Solon PalmJulie Toinette Lackie PT 05/22/2016, 2:48 PM  [facility] [address] [place], [place], [address] Phone: [phone]   Fax:  [phone]  Name: Penny Velasquez MRN: [id number] Date of Birth: 09/02/1970

## 2016-05-24 ENCOUNTER — Ambulatory Visit: Payer: BLUE CROSS/BLUE SHIELD | Attending: Family Medicine | Admitting: Physical Therapy

## 2016-05-24 ENCOUNTER — Encounter: Payer: Self-pay | Admitting: Physical Therapy

## 2016-05-24 DIAGNOSIS — M5441 Lumbago with sciatica, right side: Secondary | ICD-10-CM | POA: Diagnosis present

## 2016-05-24 NOTE — Therapy (Signed)
[facility] [address] [place], [place], [address] Phone: [phone]   Fax:  [phone]  Physical Therapy Treatment  Patient Details  Name: Zuriel Yeaman MRN: [id number] Date of Birth: [birth year] Referring Provider: Kathlen Brunswick  Encounter Date: 05/24/2016      PT End of Session - 05/24/16 1006    Visit Number 2   Number of Visits 12   Date for PT Re-Evaluation 07/03/16   PT Start Time 0948   PT Stop Time 1030   PT Time Calculation (min) 42 min   Activity Tolerance Patient tolerated treatment well   Behavior During Therapy [facility] for tasks assessed/performed      Past Medical History:  Diagnosis Date  . GERD (gastroesophageal reflux disease)   . Hyperlipidemia   . Hypertension     Past Surgical History:  Procedure Laterality Date  . HEMORROIDECTOMY  2007  . Laprascopic abdominal surgery     exploratory    There were no vitals filed for this visit.      Subjective Assessment - 05/24/16 0950    Subjective Patient has a h/o of LBP since 2004 when she hurt her back at work. Pt arriving today complaining of LBP of 4/10. pt reporting the dry needling at last treatment was not tolerable and wishes to not perform it again.  Pt reporting increased muscle soreness following dry needling. Pt also reporting she was too sore to try her HEP.    Pertinent History HTN, twisted sacrum. ALLERGY: PENICILLEN   Limitations Sitting;Standing   How long can you sit comfortably? 30 min   How long can you stand comfortably? 20 min   Diagnostic tests xray shows lumbar arthritis   Patient Stated Goals decrease pain and increase movement   Currently in Pain? Yes   Pain Score 4    Pain Location Back   Pain Orientation Right;Left   Pain Descriptors / Indicators Aching   Pain Radiating Towards R LE   Pain Onset 1 to 4 weeks ago   Pain Frequency Intermittent   Aggravating Factors  prolonged walking and prolonged sittitng, bending and lifting    Pain Relieving Factors rest, heat, NSAID   Effect of Pain on Daily Activities limited sitting, limited work activities   Multiple Pain Sites No                         [facility] Adult PT Treatment/Exercise - 05/24/16 0001      Exercises   Exercises Lumbar     Lumbar Exercises: Stretches   Active Hamstring Stretch 3 reps;20 seconds   Active Hamstring Stretch Limitations using sheet to assist   Single Knee to Chest Stretch 3 reps;20 seconds   Single Knee to Chest Stretch Limitations using a sheet to assist with pulling leg toward chest   Piriformis Stretch 5 reps;20 seconds   Piriformis Stretch Limitations pulling knee to opposite chest     Lumbar Exercises: Aerobic   Stationary Bike Nustep: 8 minutes level 4     Lumbar Exercises: Supine   Clam 5 reps;5 seconds   Bridge 10 reps;5 seconds     Manual Therapy   Manual Therapy Soft tissue mobilization   Soft tissue mobilization R gluteals, piriformis and IT band rolling                PT Education - 05/24/16 1006    Education provided Yes   Education Details HEP review   Person(s) Educated Patient  Methods Explanation   Comprehension Verbalized understanding;Returned demonstration             PT Long Term Goals - 05/22/16 1439      PT LONG TERM GOAL #1   Title I with HEP   Baseline     Time 6   Period Weeks   Status New     PT LONG TERM GOAL #2   Title Patient to be able to perform ADLS with 1-2/10 pain or less.   Time 6   Period Weeks   Status New     PT LONG TERM GOAL #3   Title Patient able to sit > 30 min without having to change positions.   Time 6   Period Weeks   Status New     PT LONG TERM GOAL #4   Title Patient to demo R hip flex and knee ext strength of 5/5 to improve function.   Time 6   Period Weeks   Status New               Plan - 05/24/16 1035    Clinical Impression Statement Patient presents with acute LBP  on R side with intermittent radiation down  R LE. Pt tolerated treatment well. Pt with mutiple trigger points in R gluteal area and R hip tenderness to palpation. Pt instructed  in HEP review. Skilled PT needed to continue to address pt's impairments. At end of session pt still reporting 4/10 pain.    Rehab Potential Excellent   PT Frequency 2x / week   PT Duration 6 weeks   PT Treatment/Interventions ADLs/Self Care Home Management;Cryotherapy;Electrical Stimulation;Moist Heat;Ultrasound;Patient/family education;Neuromuscular re-education;Therapeutic exercise;Therapeutic activities;Manual techniques;Dry needling   PT Next Visit Plan Assess DN; STW/TPR to R gluteals and lumbar; core stabilization exercises. Modalities prn.   PT Home Exercise Plan [facility], [facility]   Consulted and Agree with Plan of Care Patient      Patient will benefit from skilled therapeutic intervention in order to improve the following deficits and impairments:  Pain, Impaired flexibility, Decreased strength, Postural dysfunction, Improper body mechanics, Difficulty walking  Visit Diagnosis: Acute right-sided low back pain with right-sided sciatica     Problem List Patient Active Problem List   Diagnosis Date Noted  . Pain in bone of jaw 11/25/2013  . Hyperlipidemia 09/24/2013  . Hypertension 09/24/2013  . GERD (gastroesophageal reflux disease) 09/24/2013    Sharmon LeydenJennifer R Jamere Stidham, MPT 05/24/2016, 3:15 PM  [facility] [address] [place], [place], [address] Phone: [phone]   Fax:  [phone]  Name: Lora PaulaKimberly Havel MRN: [id number] Date of Birth: 04/01/1971

## 2016-05-25 ENCOUNTER — Encounter: Payer: BLUE CROSS/BLUE SHIELD | Admitting: Physical Therapy

## 2016-05-28 IMAGING — DX DG CHEST 2V
2 series · 2 of 2 positions shown · non-contrast
Comparison: None

CLINICAL DATA: Chest pain for 2 days worse today, history
hypertension, hyperlipidemia, former smoker

EXAM:
CHEST  2 VIEW

[chest pa]
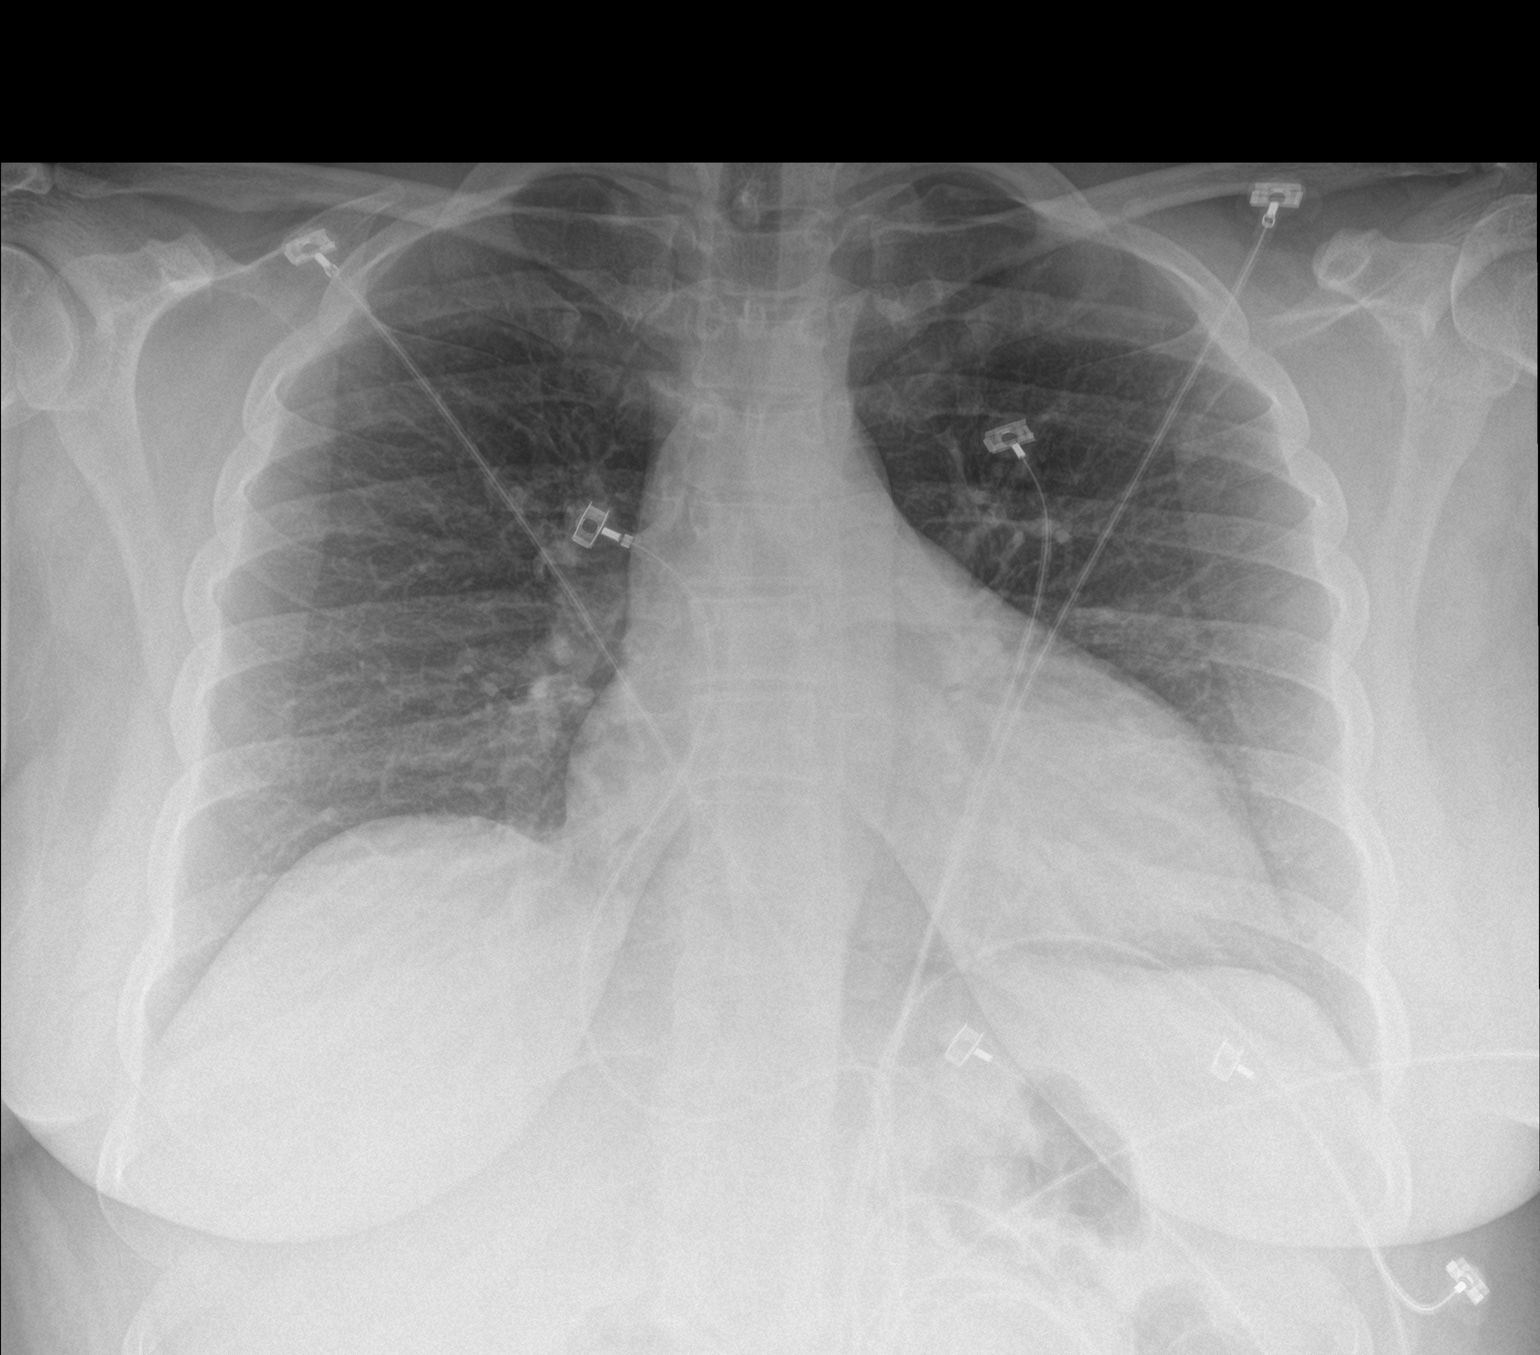

[chest lat]
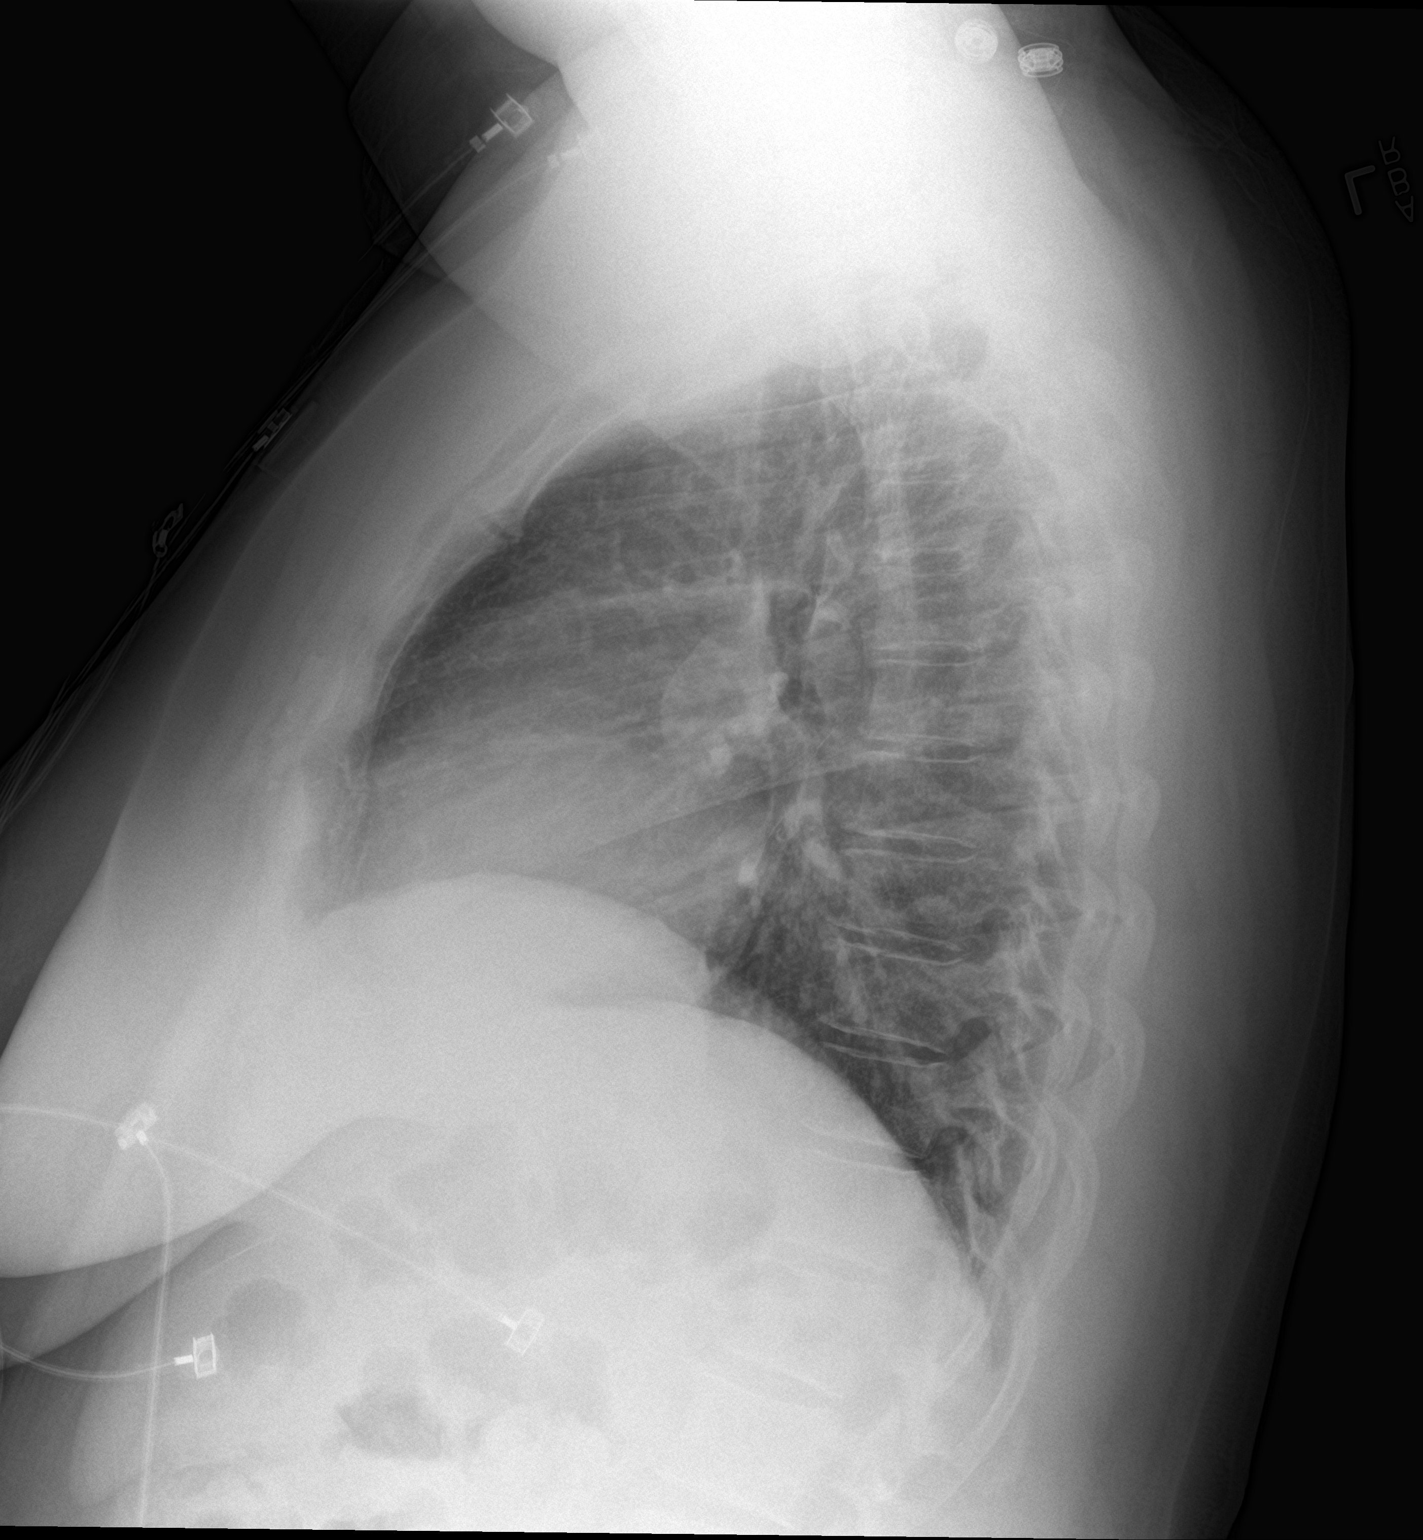

[2 of 2 positions shown; findings below may reference images not displayed]

FINDINGS: Enlargement of cardiac silhouette.

Mediastinal contours and pulmonary vascularity normal.

Lungs clear.

No pleural effusion or pneumothorax.

Bones unremarkable.
IMPRESSION: Enlargement of cardiac silhouette.

No acute abnormalities.

## 2016-05-29 ENCOUNTER — Encounter: Payer: Self-pay | Admitting: Physical Therapy

## 2016-05-29 ENCOUNTER — Ambulatory Visit: Payer: BLUE CROSS/BLUE SHIELD | Admitting: Physical Therapy

## 2016-05-29 DIAGNOSIS — M5441 Lumbago with sciatica, right side: Secondary | ICD-10-CM | POA: Diagnosis not present

## 2016-05-29 NOTE — Therapy (Signed)
[facility] [place], [place], [address] Phone: [phone]   Fax:  [phone]  Physical Therapy Treatment  Patient Details  Name: Penny Velasquez MRN: [id number] Date of Birth: 10-16-1970 Referring Provider: Bradly Bienenstock  Encounter Date: 05/29/2016      PT End of Session - 05/29/16 1509    Visit Number 3   Number of Visits 12   Date for PT Re-Evaluation 07/03/16   PT Start Time 4818   PT Stop Time 1523   PT Time Calculation (min) 50 min   Activity Tolerance Patient tolerated treatment well   Behavior During Therapy [facility] for tasks assessed/performed      Past Medical History:  Diagnosis Date  . GERD (gastroesophageal reflux disease)   . Hyperlipidemia   . Hypertension     Past Surgical History:  Procedure Laterality Date  . HEMORROIDECTOMY  2007  . Laprascopic abdominal surgery     exploratory    There were no vitals filed for this visit.      Subjective Assessment - 05/29/16 1434    Subjective Reports increased pain yesterday as she went grocery shopping and states that people wanted to stop and talk as she shopped. Reports HEP compliance since last week except for yesterday as she already had increased pain.   Pertinent History HTN, twisted sacrum. ALLERGY: PENICILLEN   Limitations Sitting;Standing   How long can you sit comfortably? 30 min   How long can you stand comfortably? 20 min   Diagnostic tests xray shows lumbar arthritis   Patient Stated Goals decrease pain and increase movement   Currently in Pain? Yes   Pain Score 6    Pain Location Back   Pain Orientation Lower   Pain Descriptors / Indicators Sore   Pain Type Acute pain   Pain Onset 1 to 4 weeks ago   Aggravating Factors  Prolonged standing on firm floors, turning   Pain Relieving Factors Medications, heat            [facility] PT Assessment - 05/29/16 0001      Assessment   Medical Diagnosis Acute right sided LBP with sciatica    Onset Date/Surgical Date 05/07/16   Next MD Visit 06/05/2016     Precautions   Precautions None                     [facility] Adult PT Treatment/Exercise - 05/29/16 0001      Lumbar Exercises: Stretches   Active Hamstring Stretch 3 reps;30 seconds   Active Hamstring Stretch Limitations Using gait belt   Single Knee to Chest Stretch 3 reps;30 seconds   Single Knee to Chest Stretch Limitations using a sheet to assist with pulling leg toward chest   Piriformis Stretch 3 reps;30 seconds   Piriformis Stretch Limitations pulling knee to opposite chest     Lumbar Exercises: Aerobic   Stationary Bike Nustep: L5 x10 min     Lumbar Exercises: Supine   Ab Set 10 reps;5 seconds   Clam 10 reps;5 seconds   Clam Limitations with red theraband   Bridge 5 reps;5 seconds  pain began in R buttock/hip region     Modalities   Modalities Electrical Stimulation;Moist Heat     Moist Heat Therapy   Number Minutes Moist Heat 15 Minutes   Moist Heat Location Lumbar Spine     Electrical Stimulation   Electrical Stimulation Location R QL   Electrical Stimulation Action Pre-Mod  in prone  Electrical Stimulation Parameters 80-150 hzx 15 min   Electrical Stimulation Goals Pain;Tone                     PT Long Term Goals - 05/29/16 1516      PT LONG TERM GOAL #1   Title I with HEP   Baseline     Time 6   Period Weeks   Status Partially Met     PT LONG TERM GOAL #2   Title Patient to be able to perform ADLS with 8-7/56 pain or less.   Time 6   Period Weeks   Status On-going     PT LONG TERM GOAL #3   Title Patient able to sit > 30 min without having to change positions.   Time 6   Period Weeks   Status On-going     PT LONG TERM GOAL #4   Title Patient to demo R hip flex and knee ext strength of 5/5 to improve function.   Time 6   Period Weeks   Status On-going               Plan - 05/29/16 1509    Clinical Impression Statement Patient presents with  continued R low back pain into R hip region. Patient had increased pain yesterday upon standing in [employer] to get groceries and talking to people in store that limited her HEP completion. Patient began experiencing increased pain in R hip region during NuStep and with bridging activities. Patient experienced cramping/spasm like sensation and tightness in B QL during exercise as well as reports during driving today. Patient encouraged to continue HEP at home especially stretching. Patient requested to have modalities completed in prone over 2 pillows. Normal modalities response noted following removal of the modalities.   Rehab Potential Excellent   PT Frequency 2x / week   PT Duration 6 weeks   PT Treatment/Interventions ADLs/Self Care Home Management;Cryotherapy;Electrical Stimulation;Moist Heat;Ultrasound;Patient/family education;Neuromuscular re-education;Therapeutic exercise;Therapeutic activities;Manual techniques;Dry needling   PT Next Visit Plan Assess DN; STW/TPR to R gluteals and lumbar; core stabilization exercises. Modalities prn.   PT Home Exercise Plan SKTC, [place] and Agree with Plan of Care Patient      Patient will benefit from skilled therapeutic intervention in order to improve the following deficits and impairments:  Pain, Impaired flexibility, Decreased strength, Postural dysfunction, Improper body mechanics, Difficulty walking  Visit Diagnosis: Acute right-sided low back pain with right-sided sciatica     Problem List Patient Active Problem List   Diagnosis Date Noted  . Pain in bone of jaw 11/25/2013  . Hyperlipidemia 09/24/2013  . Hypertension 09/24/2013  . GERD (gastroesophageal reflux disease) 09/24/2013    Wynelle Fanny, PTA 05/29/2016, 3:30 PM  [facility] [place], [place], [address] Phone: [phone]   Fax:  [phone]  Name: Penny Velasquez MRN: [id number] Date of Birth:  10-07-1970

## 2016-05-31 ENCOUNTER — Ambulatory Visit: Payer: BLUE CROSS/BLUE SHIELD | Admitting: Physical Therapy

## 2016-05-31 ENCOUNTER — Encounter: Payer: Self-pay | Admitting: Physical Therapy

## 2016-05-31 DIAGNOSIS — M5441 Lumbago with sciatica, right side: Secondary | ICD-10-CM

## 2016-05-31 NOTE — Therapy (Signed)
[facility] [place], [place], [address] Phone: [phone]   Fax:  [phone]  Physical Therapy Treatment  Patient Details  Name: Penny Velasquez MRN: [id number] Date of Birth: January 04, 1971 Referring Provider: Bradly Bienenstock  Encounter Date: 05/31/2016      PT End of Session - 05/31/16 1435    Visit Number 4   Number of Visits 12   Date for PT Re-Evaluation 07/03/16   PT Start Time 1448   PT Stop Time 1448   PT Time Calculation (min) 43 min   Activity Tolerance Patient tolerated treatment well   Behavior During Therapy [facility] for tasks assessed/performed      Past Medical History:  Diagnosis Date  . GERD (gastroesophageal reflux disease)   . Hyperlipidemia   . Hypertension     Past Surgical History:  Procedure Laterality Date  . HEMORROIDECTOMY  2007  . Laprascopic abdominal surgery     exploratory    There were no vitals filed for this visit.      Subjective Assessment - 05/31/16 1409    Subjective Some increased pain after doing plumming under sink   Pertinent History HTN, twisted sacrum. ALLERGY: PENICILLEN   Limitations Sitting;Standing   How long can you sit comfortably? 30 min   How long can you stand comfortably? 20 min   Diagnostic tests xray shows lumbar arthritis   Patient Stated Goals decrease pain and increase movement   Currently in Pain? Yes   Pain Score 5    Pain Location Back   Pain Orientation Lower;Right   Pain Descriptors / Indicators Sore   Pain Type Chronic pain;Acute pain   Pain Onset 1 to 4 weeks ago   Pain Frequency Intermittent   Aggravating Factors  bending   Pain Relieving Factors meds/rest                         [facility] Adult PT Treatment/Exercise - 05/31/16 0001      Lumbar Exercises: Supine   Ab Set 5 seconds;10 reps   Glut Set 10 reps;5 seconds   Bent Knee Raise 3 seconds  2x10   Straight Leg Raise 3 seconds  2x10     Modalities   Modalities  Moist Heat;Electrical Stimulation;Ultrasound     Moist Heat Therapy   Number Minutes Moist Heat 15 Minutes   Moist Heat Location Lumbar Spine     Electrical Stimulation   Electrical Stimulation Location R QL   Electrical Stimulation Action premod   Electrical Stimulation Parameters 80-150hz  x27mn   Electrical Stimulation Goals Pain;Tone     Ultrasound   Ultrasound Location right SI area of pain   Ultrasound Parameters 1.5w/cm2/50%/162m x1058m  Ultrasound Goals Pain                     PT Long Term Goals - 05/29/16 1516      PT LONG TERM GOAL #1   Title I with HEP   Baseline     Time 6   Period Weeks   Status Partially Met     PT LONG TERM GOAL #2   Title Patient to be able to perform ADLS with 1-21-8/56in or less.   Time 6   Period Weeks   Status On-going     PT LONG TERM GOAL #3   Title Patient able to sit > 30 min without having to change positions.   Time 6   Period Weeks  Status On-going     PT LONG TERM GOAL #4   Title Patient to demo R hip flex and knee ext strength of 5/5 to improve function.   Time 6   Period Weeks   Status On-going               Plan - 05/31/16 1438    Clinical Impression Statement Patient tolerated treatment well today. Patient only able to tolerated low level exercises today due to sore back after doing plumming under her sink at home. Patient able to perform core activation with good technique and educated posture on posture awareness techniques. Patient felt better after [place] treatment.  Patient goals ongoing to pain deficts.   Rehab Potential Excellent   PT Frequency 2x / week   PT Duration 6 weeks   PT Treatment/Interventions ADLs/Self Care Home Management;Cryotherapy;Electrical Stimulation;Moist Heat;Ultrasound;Patient/family education;Neuromuscular re-education;Therapeutic exercise;Therapeutic activities;Manual techniques;Dry needling   PT Next Visit Plan cont with POC for core stabilization/US/STW/heat/ES as  needed   Consulted and Agree with Plan of Care Patient      Patient will benefit from skilled therapeutic intervention in order to improve the following deficits and impairments:  Pain, Impaired flexibility, Decreased strength, Postural dysfunction, Improper body mechanics, Difficulty walking  Visit Diagnosis: Acute right-sided low back pain with right-sided sciatica     Problem List Patient Active Problem List   Diagnosis Date Noted  . Pain in bone of jaw 11/25/2013  . Hyperlipidemia 09/24/2013  . Hypertension 09/24/2013  . GERD (gastroesophageal reflux disease) 09/24/2013    Penny Velasquez P, PTA 05/31/2016, 2:54 PM  [facility] [address] [place], [place], [address] Phone: [phone]   Fax:  [phone]  Name: Penny Velasquez MRN: [id number] Date of Birth: 10-26-1970

## 2016-06-05 ENCOUNTER — Encounter: Payer: Self-pay | Admitting: Physical Therapy

## 2016-06-05 ENCOUNTER — Ambulatory Visit: Payer: BLUE CROSS/BLUE SHIELD | Admitting: Physical Therapy

## 2016-06-05 DIAGNOSIS — M5441 Lumbago with sciatica, right side: Secondary | ICD-10-CM | POA: Diagnosis not present

## 2016-06-05 NOTE — Therapy (Signed)
[facility] [place], [place], [address] Phone: [phone]   Fax:  [phone]  Physical Therapy Treatment  Patient Details  Name: Penny Velasquez MRN: [id number] Date of Birth: 15-Apr-1971 Referring Provider: Bradly Bienenstock  Encounter Date: 06/05/2016      PT End of Session - 06/05/16 1441    Visit Number 5   Number of Visits 12   Date for PT Re-Evaluation 07/03/16   PT Start Time 1435   PT Stop Time 1531   PT Time Calculation (min) 56 min   Activity Tolerance Patient tolerated treatment well   Behavior During Therapy [facility] for tasks assessed/performed      Past Medical History:  Diagnosis Date  . GERD (gastroesophageal reflux disease)   . Hyperlipidemia   . Hypertension     Past Surgical History:  Procedure Laterality Date  . HEMORROIDECTOMY  2007  . Laprascopic abdominal surgery     exploratory    There were no vitals filed for this visit.      Subjective Assessment - 06/05/16 1435    Subjective Reports that since she began using lidocaine patches on her back that she has seen improvement. States that she has been taken off of anti inflammatory and placed on high dose of prednisone. Since being off of anti inflammatory medication her R ankle and hip has began hurting. Reports that she has an MRI in [place] with [facility].   Pertinent History HTN, twisted sacrum. ALLERGY: PENICILLEN   Limitations Sitting;Standing   How long can you sit comfortably? 30 min   How long can you stand comfortably? 20 min   Diagnostic tests xray shows lumbar arthritis   Patient Stated Goals decrease pain and increase movement   Currently in Pain? Yes   Pain Score 3    Pain Location Back   Pain Orientation Right;Lower   Pain Descriptors / Indicators Sore   Pain Type Chronic pain   Pain Radiating Towards RLE   Pain Onset 1 to 4 weeks ago            [facility] PT Assessment - 06/05/16 0001      Assessment   Medical  Diagnosis Acute right sided LBP with sciatica   Onset Date/Surgical Date 05/07/16   Next MD Visit 06/06/2016     Precautions   Precautions None                     [facility] Adult PT Treatment/Exercise - 06/05/16 0001      Lumbar Exercises: Supine   Ab Set 20 reps;5 seconds   Glut Set 20 reps;5 seconds   Bent Knee Raise 3 seconds  x40 reps   Straight Leg Raise 20 reps;3 seconds     Lumbar Exercises: Sidelying   Clam 15 reps  R hip clam     Modalities   Modalities Moist Heat;Electrical Stimulation;Ultrasound  all modalities completed in prone per patient request     Moist Heat Therapy   Number Minutes Moist Heat 15 Minutes   Moist Heat Location Lumbar Spine     Electrical Stimulation   Electrical Stimulation Location R QL   Electrical Stimulation Action IFC   Electrical Stimulation Parameters 80-150 hz x15 min   Electrical Stimulation Goals Pain;Tone     Ultrasound   Ultrasound Location R QL     Ultrasound Parameters 1.5 w/cm2, 100%, 1 mhz x10 min   Ultrasound Goals Pain  PT Long Term Goals - 05/29/16 1516      PT LONG TERM GOAL #1   Title I with HEP   Baseline     Time 6   Period Weeks   Status Partially Met     PT LONG TERM GOAL #2   Title Patient to be able to perform ADLS with 3-5/70 pain or less.   Time 6   Period Weeks   Status On-going     PT LONG TERM GOAL #3   Title Patient able to sit > 30 min without having to change positions.   Time 6   Period Weeks   Status On-going     PT LONG TERM GOAL #4   Title Patient to demo R hip flex and knee ext strength of 5/5 to improve function.   Time 6   Period Weeks   Status On-going               Plan - 06/05/16 1520    Clinical Impression Statement Patient tolerated today's treatment well although she is anxious regarding MRI and MD visits tomorrow. Patient had pinching in R low back just lateral to spine throughout supine core strengthening. Patient  experienced weakness in LLE with supine marching but also had some pain in R low back with SLR. Patient continues to be very tender over R QL upon palpation thus [place] completed with normal response noted. Normal electrical stimulation and moist heat response noted in prone per patient patient request. Patient experienced irritation of R SI joint with prone positioning following treatment.   Rehab Potential Excellent   PT Frequency 2x / week   PT Duration 6 weeks   PT Treatment/Interventions ADLs/Self Care Home Management;Cryotherapy;Electrical Stimulation;Moist Heat;Ultrasound;Patient/family education;Neuromuscular re-education;Therapeutic exercise;Therapeutic activities;Manual techniques;Dry needling   PT Next Visit Plan cont with POC for core stabilization/US/STW/heat/ES as needed   PT Home Exercise Plan [facility], [place] and Agree with Plan of Care Patient      Patient will benefit from skilled therapeutic intervention in order to improve the following deficits and impairments:  Pain, Impaired flexibility, Decreased strength, Postural dysfunction, Improper body mechanics, Difficulty walking  Visit Diagnosis: Acute right-sided low back pain with right-sided sciatica     Problem List Patient Active Problem List   Diagnosis Date Noted  . Pain in bone of jaw 11/25/2013  . Hyperlipidemia 09/24/2013  . Hypertension 09/24/2013  . GERD (gastroesophageal reflux disease) 09/24/2013    Wynelle Fanny, PTA 06/05/2016, 3:35 PM  [facility] [address] [place], [place], [address] Phone: [phone]   Fax:  [phone]  Name: Penny Velasquez MRN: [id number] Date of Birth: 1970/08/14

## 2016-06-06 ENCOUNTER — Ambulatory Visit: Payer: BLUE CROSS/BLUE SHIELD | Admitting: Family Medicine

## 2016-06-06 ENCOUNTER — Ambulatory Visit (INDEPENDENT_AMBULATORY_CARE_PROVIDER_SITE_OTHER): Payer: BLUE CROSS/BLUE SHIELD | Admitting: Physical Medicine and Rehabilitation

## 2016-06-06 ENCOUNTER — Encounter (INDEPENDENT_AMBULATORY_CARE_PROVIDER_SITE_OTHER): Payer: Self-pay | Admitting: Physical Medicine and Rehabilitation

## 2016-06-06 VITALS — BP 138/93 | HR 82

## 2016-06-06 DIAGNOSIS — G8929 Other chronic pain: Secondary | ICD-10-CM

## 2016-06-06 DIAGNOSIS — M461 Sacroiliitis, not elsewhere classified: Secondary | ICD-10-CM

## 2016-06-06 DIAGNOSIS — M25551 Pain in right hip: Secondary | ICD-10-CM

## 2016-06-06 DIAGNOSIS — M5441 Lumbago with sciatica, right side: Secondary | ICD-10-CM | POA: Diagnosis not present

## 2016-06-06 NOTE — Progress Notes (Signed)
Penny Velasquez - 46 y.o. female MRN [id number]  Date of birth: 15-Jul-1970  Office Visit Note: Visit Date: 06/06/2016 PCP: WHITE, MARSHA L, NP Referred by: No ref. provider found  Subjective: Chief Complaint  Patient presents with  . Lower Back - Pain  . Right Hip - Pain   HPI: [name] is a 46 year old female who works as a Production designer, theatre/television/filmmanager at [employer] who reported onset of right-sided low back pain on 05/07/2016 without any specific injury. She initially saw her primary care physician Dr. Cliffton AstersWhite who prescribed meloxicam and Robaxin as well as giving her a Toradol shot. The patient states that helps somewhat and then the pain continued. It progressed to the point where it has radiated into the thigh to a degree posteriorly and anteriorly laterally. She denies any numbness or paresthesias or focal weakness. The patient says she thinks it is her SI Joint. She reports that she "Twisted her sacrum" around 2004 after squatting down to help a young person through a door at work. She reports off and on pain ever since on the right side.. Pain is right sided. Feels the pain all the time, but it flares up and is worse at certain times than others. Historically she can usually relieve pain with Aleve and rest, but now that doesn't seem to be helping. She does report some groin pain on the right. She has been out of work for 6 weeks due to these pain complaints. She historically has had migraine headaches too and had to use FMLA for that. 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 She reports worsening pain with walking on concrete floors, twisting, and using ladders.  She wants to know if there is something that can be adjusted at work to help. With part of the worsening pain complaints her primary physician switched her anti-inflammatories from meloxicam to diclofenac. She feels like the baclofen back does help some. She has also been given prednisone and she is on the last few days of her prednisone taper it  doesn't feel like this help very much. She is undergoing physical therapy with exercises and stretching at this point. She's had a few sessions of that without relief. They actually do have an MRI ordered for tomorrow. She has not had prior lumbar surgery or interventions or injections. She has not had prior MRI imaging. X-ray of the lumbar spine was done on 05/14/2016 and reviewed below. Some very mild arthritis of the lumbar spine.     Review of Systems  Constitutional: Negative for chills, fever, malaise/fatigue and weight loss.  HENT: Negative for hearing loss and sinus pain.   Eyes: Negative for blurred vision, double vision and photophobia.  Respiratory: Negative for cough and shortness of breath.   Cardiovascular: Negative for chest pain, palpitations and leg swelling.  Gastrointestinal: Negative for abdominal pain, nausea and vomiting.  Genitourinary: Negative for flank pain.  Musculoskeletal: Positive for back pain and joint pain. Negative for myalgias.  Skin: Negative for itching and rash.  Neurological: Negative for tremors, focal weakness and weakness.  Endo/Heme/Allergies: Negative.   Psychiatric/Behavioral: Negative for depression.  All other systems reviewed and are negative.  Otherwise per HPI.  Assessment & Plan: Visit Diagnoses:  1. Chronic right-sided low back pain with right-sided sciatica   2. Sacroiliitis (HCC)   3. Pain in right hip     Plan: Findings:  Chronic history of low back pain mostly right sided referral into the upper buttock and lateral hip and sometimes groin pain  and thigh pain. No radicular complaints with paresthesia specifically. No focal foot drop. No bowel or bladder symptoms. No red flag symptoms. In the past she had gotten better with conservative care usually with rest as well as anti-inflammatories. This bout of flareup has really not responded to much of anything except maybe the diclofenac. She has been using some diclofenac gel as well and it  does seem to help over the bursa area on the right. The physical therapy has helped a little bit of a degree and she is continuing with that. MRI is pending tomorrow. I have told her that really the best thing we can do is see what the MRI shows. The current imaging which is x-ray does not show much except for some mild arthritis. Exam is really nonfocal. She has some mild pain with Patrick's testing at the very end extreme on the right. She has a positive Fortin finger sign and pointing over the sacroiliac joint. Sacroiliac joint pain is notoriously absent of findings on imaging. She has not had focal manipulation of the sacroiliac joint. She is very concerned and rightfully concerned that with the time off that there is some issue with her job. It is somewhat complicated when she tells me the ramifications of paperwork and being out or day and having 6 days off without pay etc. I have told her unfortunately from the findings on the imaging as well as exam is not much I can say in terms of not being able to work. A lot of that is self-limited because of the pain. I did write a note today trying to help her with work as far as minimizing standing and stooping and twisting. I think there is no specific set criteria other than to minimize that and to keep out of certain positions that are painful. Unfortunately the best thing is to see what the MRI shows an active accordingly from that. She might benefit from diagnostic and therapeutic sacroiliac joint injection or epidural or even facet joint block. I spent more than 30 minutes speaking face-to-face with the patient with 50% of the time in counseling.    Meds & Orders: No orders of the defined types were placed in this encounter.  No orders of the defined types were placed in this encounter.   Follow-up: Return if symptoms worsen or fail to improve, for Potential diagnostic injection depending on MRI results.   Procedures: No procedures performed  No notes  on file   Clinical History: Lumbar spine plain film x-ray 05/14/2016  IMPRESSION:  Mild lower lumbar facet arthropathy. Moderate amount retained stool. Result Narrative INDICATION: Back pain  COMPARISON: None available  TECHNIQUE: 5 views lumbar spine  FINDINGS: 5 lumbar type nonrib-bearing vertebral bodies are present. There is no scoliosis in the bilateral SI joints are well maintained. Vertebral body heights and disc spaces are preserved without acute fracture or subluxation. Paraspinal soft tissues  are within normal limits. Moderate amount retained stool. No abnormal calcification. Mild lower lumbar facet arthropathy.  She reports that she quit smoking about 9 years ago. Her smoking use included Cigarettes. She has never used smokeless tobacco. No results for input(s): HGBA1C, LABURIC in the last 8760 hours.  Objective:  VS:  HT:    WT:   BMI:     BP:(!) 138/93  HR:82bpm  TEMP: ( )  RESP:  Physical Exam  Constitutional: She is oriented to person, place, and time. She appears well-developed and well-nourished. No distress.  HENT:  Head:  Normocephalic and atraumatic.  Nose: Nose normal.  Mouth/Throat: Oropharynx is clear and moist.  Eyes: Conjunctivae are normal. Pupils are equal, round, and reactive to light.  Neck: Normal range of motion. Neck supple.  Cardiovascular: Regular rhythm and intact distal pulses.   Pulmonary/Chest: Effort normal and breath sounds normal.  Abdominal: She exhibits no distension.  Musculoskeletal:  The patient ambulates without aid with a normal gait.  They have no pain with hip internal or external rotation. Patrick's testing is painful on the right at the end extreme. She has a normal Patrick's testing on the left. She has a positive Fortin finger sign on the right There is mild pain with extension rotation of the lumbar spine. They have no pain over the greater trochanters or PSIS.  On manual muscle testing there is 5/5 strength in all muscle  groups of the lower extremities bilaterally without deficits.  There are 2+ muscle stretch reflexes at the quadriceps and gastrocnemius.  There is no clonus bilaterally.  Slump test is negative bilaterally.   Neurological: She is alert and oriented to person, place, and time.  Skin: Skin is warm. No rash noted. No erythema.  Psychiatric: She has a normal mood and affect. Her behavior is normal.  Nursing note and vitals reviewed.   Ortho Exam Imaging: No results found.  Past Medical/Family/Surgical/Social History: Medications & Allergies reviewed per EMR Patient Active Problem List   Diagnosis Date Noted  . Pain in bone of jaw 11/25/2013  . Hyperlipidemia 09/24/2013  . Hypertension 09/24/2013  . GERD (gastroesophageal reflux disease) 09/24/2013   Past Medical History:  Diagnosis Date  . GERD (gastroesophageal reflux disease)   . Hyperlipidemia   . Hypertension    Family History  Problem Relation Age of Onset  . Anemia Mother   . Hypertension Mother   . Hypertension Father   . Heart disease Father   . Cancer Father     prostate   Past Surgical History:  Procedure Laterality Date  . HEMORROIDECTOMY  2007  . Laprascopic abdominal surgery     exploratory   Social History   Occupational History  . Not on file.   Social History Main Topics  . Smoking status: Former Smoker    Types: Cigarettes    Quit date: 09/25/2006  . Smokeless tobacco: Never Used  . Alcohol use No  . Drug use: No  . Sexual activity: No

## 2016-06-07 ENCOUNTER — Encounter (INDEPENDENT_AMBULATORY_CARE_PROVIDER_SITE_OTHER): Payer: Self-pay | Admitting: Physical Medicine and Rehabilitation

## 2016-06-07 ENCOUNTER — Ambulatory Visit: Payer: BLUE CROSS/BLUE SHIELD | Admitting: Family Medicine

## 2016-06-07 ENCOUNTER — Encounter: Payer: BLUE CROSS/BLUE SHIELD | Admitting: Physical Therapy

## 2016-06-07 DIAGNOSIS — M5116 Intervertebral disc disorders with radiculopathy, lumbar region: Secondary | ICD-10-CM

## 2016-06-07 NOTE — Progress Notes (Signed)
Patient brought NCV of MRI of the lumbar spine performed yesterday 06/06/2016. 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 There is no focal nerve compression. At L5-S1 there is a small right paracentral protrusion which could irritate the L5 or S1 nerve root. There is no focal compression. There is right more than left facet arthritis. Official report is not available as of yet. We will schedule the patient for epidural injection.

## 2016-06-08 ENCOUNTER — Telehealth (INDEPENDENT_AMBULATORY_CARE_PROVIDER_SITE_OTHER): Payer: Self-pay

## 2016-06-08 NOTE — Telephone Encounter (Signed)
Spoke with pt about works status. She has an appt on Monday for the injection and said she would like to get a note then for the no lifting over 40 lbs.

## 2016-06-08 NOTE — Telephone Encounter (Signed)
-----   Message from Tyrell AntonioFrederic Newton, MD sent at 06/08/2016  6:18 AM EST ----- We can discuss this further with her when I see her but you can tell her that she has 2 small disc protrusions and there is no way to say that based on her job function and standing and walking on concrete floors would "damage" the tissue structures further. There are people walking around same problem that really don't even know they have it and are continuing to do that type of work. However, as I told her doing that may exacerbate her symptoms. From a work restriction standpoint about the only thing I would consider prudent would be no lifting over 40 pounds.   ----- Message ----- From: Cruzita LedererAmy N Sabreena Vogan, RMA Sent: 06/07/2016   3:18 PM To: Tyrell AntonioFrederic Newton, MD  Spoke with pt and have her scheduled for ESI. She wants to know if based on her MRI report if it is ok for her to return to work with no restrictions? They do not have a job for her to accommodate the restrictions on the note she was given yesterday and she is wanting to return to work but doesn't want to "do any more damage."

## 2016-06-11 ENCOUNTER — Encounter (INDEPENDENT_AMBULATORY_CARE_PROVIDER_SITE_OTHER): Payer: BLUE CROSS/BLUE SHIELD | Admitting: Physical Medicine and Rehabilitation

## 2016-06-13 ENCOUNTER — Ambulatory Visit: Payer: BLUE CROSS/BLUE SHIELD | Admitting: Physical Therapy

## 2016-06-13 ENCOUNTER — Encounter: Payer: Self-pay | Admitting: Physical Therapy

## 2016-06-13 DIAGNOSIS — M5441 Lumbago with sciatica, right side: Secondary | ICD-10-CM

## 2016-06-13 NOTE — Therapy (Signed)
[facility] [place], [place], [address] Phone: [phone]   Fax:  [phone]  Physical Therapy Treatment  Patient Details  Name: Penny Velasquez MRN: [id number] Date of Birth: 1970/12/09 Referring Provider: Bradly Bienenstock  Encounter Date: 06/13/2016      PT End of Session - 06/13/16 1312    Visit Number 6   Number of Visits 12   Date for PT Re-Evaluation 07/03/16   PT Start Time 1302   PT Stop Time 1400   PT Time Calculation (min) 58 min   Activity Tolerance Patient tolerated treatment well   Behavior During Therapy [facility] for tasks assessed/performed      Past Medical History:  Diagnosis Date  . GERD (gastroesophageal reflux disease)   . Hyperlipidemia   . Hypertension     Past Surgical History:  Procedure Laterality Date  . HEMORROIDECTOMY  2007  . Laprascopic abdominal surgery     exploratory    There were no vitals filed for this visit.      Subjective Assessment - 06/13/16 1303    Subjective Reports that yesterday she had pain but is having a good day today. Reports that she is uncertain about returning to her job at this time. Reports that she has been lying on her stomach when she plays her game at times.   Pertinent History HTN, twisted sacrum. ALLERGY: PENICILLEN   Limitations Sitting;Standing   How long can you sit comfortably? 30 min   How long can you stand comfortably? 20 min   Diagnostic tests xray shows lumbar arthritis   Patient Stated Goals decrease pain and increase movement   Currently in Pain? Yes   Pain Score 1    Pain Location Back   Pain Orientation Right;Lower   Pain Descriptors / Indicators Discomfort   Pain Type Chronic pain   Pain Onset 1 to 4 weeks ago            [facility] PT Assessment - 06/13/16 0001      Assessment   Medical Diagnosis Acute right sided LBP with sciatica   Onset Date/Surgical Date 05/07/16   Next MD Visit 06/22/2016     Precautions   Precautions None                      [facility] Adult PT Treatment/Exercise - 06/13/16 0001      Lumbar Exercises: Stretches   Lower Trunk Rotation 5 reps;20 seconds   Standing Side Bend 3 reps;20 seconds     Lumbar Exercises: Aerobic   Stationary Bike Nustep: L5 x10 min     Lumbar Exercises: Supine   Bridge 15 reps;5 seconds     Lumbar Exercises: Prone   Other Prone Lumbar Exercises POE x3 min   Other Prone Lumbar Exercises Pelvic press with HS curl x10 reps each, pelvic press with hip ext x10 reps each     Modalities   Modalities Moist Heat;Electrical Stimulation;Ultrasound     Moist Heat Therapy   Number Minutes Moist Heat 10 Minutes   Moist Heat Location Lumbar Spine     Electrical Stimulation   Electrical Stimulation Location R SI/ low back   Electrical Stimulation Action Pre-Mod   Electrical Stimulation Parameters 80-150 hz x10 min   Electrical Stimulation Goals Pain     Ultrasound   Ultrasound Location R SI joint/ low back   Ultrasound Parameters 1.5 w/cm2, 100%, 1 mhz x10 min   Ultrasound Goals Pain  PT Long Term Goals - 05/29/16 1516      PT LONG TERM GOAL #1   Title I with HEP   Baseline     Time 6   Period Weeks   Status Partially Met     PT LONG TERM GOAL #2   Title Patient to be able to perform ADLS with 8-1/15 pain or less.   Time 6   Period Weeks   Status On-going     PT LONG TERM GOAL #3   Title Patient able to sit > 30 min without having to change positions.   Time 6   Period Weeks   Status On-going     PT LONG TERM GOAL #4   Title Patient to demo R hip flex and knee ext strength of 5/5 to improve function.   Time 6   Period Weeks   Status On-going               Plan - 06/13/16 1357    Clinical Impression Statement Patient tolerated today's treatment well today as she was not having much pain today. Patient was guided through many R low back stretches with patient only experiencing minimal R QL  stretch. Prone exercises completed today in efforts to reduce pain and disc protrusion. Patient had no complaints in prone positioning and had no complaints with bridging. [place] completed to R low back/ SI region per patient report of benefit. Patient encouraged to continue prone lying and prone exercises as demonstrated today.    Rehab Potential Excellent   PT Frequency 2x / week   PT Duration 6 weeks   PT Treatment/Interventions ADLs/Self Care Home Management;Cryotherapy;Electrical Stimulation;Moist Heat;Ultrasound;Patient/family education;Neuromuscular re-education;Therapeutic exercise;Therapeutic activities;Manual techniques;Dry needling   PT Next Visit Plan cont with POC for core stabilization/US/STW/heat/ES as needed   PT Home Exercise Plan [facility], [place] and Agree with Plan of Care Patient      Patient will benefit from skilled therapeutic intervention in order to improve the following deficits and impairments:  Pain, Impaired flexibility, Decreased strength, Postural dysfunction, Improper body mechanics, Difficulty walking  Visit Diagnosis: Acute right-sided low back pain with right-sided sciatica     Problem List Patient Active Problem List   Diagnosis Date Noted  . Pain in bone of jaw 11/25/2013  . Hyperlipidemia 09/24/2013  . Hypertension 09/24/2013  . GERD (gastroesophageal reflux disease) 09/24/2013    Wynelle Fanny, PTA 06/13/2016, 2:04 PM  [facility] [address] [place], [place], [address] Phone: [phone]   Fax:  [phone]  Name: Penny Velasquez MRN: [id number] Date of Birth: 10-Jan-1971

## 2016-06-18 ENCOUNTER — Ambulatory Visit: Payer: BLUE CROSS/BLUE SHIELD | Admitting: Physical Therapy

## 2016-06-18 DIAGNOSIS — M5441 Lumbago with sciatica, right side: Secondary | ICD-10-CM

## 2016-06-18 NOTE — Therapy (Signed)
[facility] [place], [place], [address] Phone: [phone]   Fax:  [phone]  Physical Therapy Treatment  Patient Details  Name: Penny Velasquez MRN: [id number] Date of Birth: January 29, 1971 Referring Provider: Bradly Bienenstock  Encounter Date: 06/18/2016      PT End of Session - 06/18/16 1439    Visit Number 7   Number of Visits 12   Date for PT Re-Evaluation 07/03/16   PT Start Time 2025   PT Stop Time 1529   PT Time Calculation (min) 50 min   Activity Tolerance Patient tolerated treatment well   Behavior During Therapy [facility] for tasks assessed/performed      Past Medical History:  Diagnosis Date  . GERD (gastroesophageal reflux disease)   . Hyperlipidemia   . Hypertension     Past Surgical History:  Procedure Laterality Date  . HEMORROIDECTOMY  2007  . Laprascopic abdominal surgery     exploratory    There were no vitals filed for this visit.      Subjective Assessment - 06/18/16 1439    Subjective Patient states she would like to try DN again. She knows what to expect now and has less pain than at initial visit. She states she is ready to get back to work. She's doing much better, but still has some back pain and intermittent pain into RLE when she doesn't take her antiinflammatory.   Pertinent History HTN, twisted sacrum. ALLERGY: PENICILLEN   Limitations Sitting;Standing   How long can you stand comfortably? able to grocery shop without pain for one hour   Diagnostic tests xray shows lumbar arthritis   Patient Stated Goals decrease pain and increase movement   Currently in Pain? Yes   Pain Score 1    Pain Location Back   Pain Orientation Right;Lower   Pain Descriptors / Indicators Discomfort   Pain Type Chronic pain   Pain Radiating Towards into R foot without antiinflammatory   Pain Onset More than a month ago   Pain Frequency Intermittent   Aggravating Factors  bending   Pain Relieving Factors meds    Effect of Pain on Daily Activities not working                         [facility] Adult PT Treatment/Exercise - 06/18/16 0001      Lumbar Exercises: Stretches   Single Knee to Chest Stretch Other (comment)  Single knee to opp chest, figure 4 piriformis stretch x 20 s   ITB Stretch --  attempted SDLY QL stretch but patient could not feel   Piriformis Stretch 20 seconds  standing pigeon     Modalities   Modalities Electrical Stimulation;Moist Heat     Moist Heat Therapy   Number Minutes Moist Heat 15 Minutes   Moist Heat Location Lumbar Spine;Hip     Electrical Stimulation   Electrical Stimulation Location low back and R SI/gluteals   Electrical Stimulation Action premod   Electrical Stimulation Parameters 80-150 Hz x 15 min   Electrical Stimulation Goals Pain     Manual Therapy   Manual Therapy Soft tissue mobilization;Myofascial release   Soft tissue mobilization to B lumbar paraspinals and R gluteals/piriformis   Myofascial Release to same          Trigger Point Dry Needling - 06/18/16 1540    Consent Given? Yes   Education Handout Provided No   Muscles Treated Upper Body Quadratus Lumborum  multifidi B L4  and L5   Muscles Treated Lower Body Gluteus minimus;Gluteus maximus;Piriformis  R   Gluteus Maximus Response Twitch response elicited;Palpable increased muscle length   Gluteus Minimus Response Twitch response elicited;Palpable increased muscle length   Piriformis Response Twitch response elicited;Palpable increased muscle length              PT Education - 06/18/16 1544    Education provided Yes   Education Details HEP - standing pigeon and self MFR with tennis ball    Person(s) Educated Patient   Methods Explanation;Demonstration;Verbal cues   Comprehension Verbalized understanding;Returned demonstration             PT Long Term Goals - 05/29/16 1516      PT LONG TERM GOAL #1   Title I with HEP   Baseline     Time 6    Period Weeks   Status Partially Met     PT LONG TERM GOAL #2   Title Patient to be able to perform ADLS with 9-7/74 pain or less.   Time 6   Period Weeks   Status On-going     PT LONG TERM GOAL #3   Title Patient able to sit > 30 min without having to change positions.   Time 6   Period Weeks   Status On-going     PT LONG TERM GOAL #4   Title Patient to demo R hip flex and knee ext strength of 5/5 to improve function.   Time 6   Period Weeks   Status On-going               Plan - 06/18/16 1545    Clinical Impression Statement Patient did much better with DN today. She had multiple ++ localized twitch responses throughout gluteals and pirifomis. No significant twitch in R QL. She was hypersensitive to deep MFR of gluteals but tolerated mod STW. She continues to have tightness in R hip and will benefit from consistent stretching.      Patient will benefit from skilled therapeutic intervention in order to improve the following deficits and impairments:     Visit Diagnosis: Acute right-sided low back pain with right-sided sciatica     Problem List Patient Active Problem List   Diagnosis Date Noted  . Pain in bone of jaw 11/25/2013  . Hyperlipidemia 09/24/2013  . Hypertension 09/24/2013  . GERD (gastroesophageal reflux disease) 09/24/2013    Madelyn Flavors PT 06/18/2016, 3:49 PM  [facility] [address] [place], [place], [address] Phone: [phone]   Fax:  [phone]  Name: Penny Velasquez MRN: [id number] Date of Birth: [birth year]

## 2016-06-21 ENCOUNTER — Ambulatory Visit: Payer: BLUE CROSS/BLUE SHIELD | Attending: Family Medicine | Admitting: *Deleted

## 2016-06-21 DIAGNOSIS — M5441 Lumbago with sciatica, right side: Secondary | ICD-10-CM | POA: Diagnosis present

## 2016-06-21 NOTE — Therapy (Addendum)
[facility] [place], [place], [address] Phone: [phone]   Fax:  [phone]  Physical Therapy Treatment  Patient Details  Name: Penny Velasquez MRN: [id number] Date of Birth: 14-Mar-1971 Referring Provider: Bradly Bienenstock  Encounter Date: 06/21/2016      PT End of Session - 06/21/16 1630    Visit Number 8   Number of Visits 12   Date for PT Re-Evaluation 07/03/16   PT Start Time 1430   PT Stop Time 1520   PT Time Calculation (min) 50 min      Past Medical History:  Diagnosis Date  . GERD (gastroesophageal reflux disease)   . Hyperlipidemia   . Hypertension     Past Surgical History:  Procedure Laterality Date  . HEMORROIDECTOMY  2007  . Laprascopic abdominal surgery     exploratory    There were no vitals filed for this visit.      Subjective Assessment - 06/21/16 1438    Subjective Very sore today after DN. Will see Surgeon tomorrow   Pertinent History HTN, twisted sacrum. ALLERGY: PENICILLEN   Limitations Sitting;Standing   How long can you sit comfortably? 30 min   How long can you stand comfortably? able to grocery shop without pain for one hour   Diagnostic tests xray shows lumbar arthritis   Patient Stated Goals decrease pain and increase movement   Currently in Pain? Yes   Pain Score 6    Pain Location Back   Pain Orientation Right;Lower   Pain Descriptors / Indicators Discomfort   Pain Type Chronic pain      Right posterior pelvic rotation.  Right LE shorter.  Equal leg lengths established after adductor squeeze and resisted right hip flexion.  Patient was very palpably tender over her right SIJ.                                PT Long Term Goals - 05/29/16 1516      PT LONG TERM GOAL #1   Title I with HEP   Baseline     Time 6   Period Weeks   Status Partially Met     PT LONG TERM GOAL #2   Title Patient to be able to perform ADLS with 4-2/70 pain or  less.   Time 6   Period Weeks   Status On-going     PT LONG TERM GOAL #3   Title Patient able to sit > 30 min without having to change positions.   Time 6   Period Weeks   Status On-going     PT LONG TERM GOAL #4   Title Patient to demo R hip flex and knee ext strength of 5/5 to improve function.   Time 6   Period Weeks   Status On-going               Plan - 06/21/16 1633    Clinical Impression Statement Pt arrived to clinic today with LBP 6/10 and aching. She didn't do as well with DN and had more aching this time. She was very sore in RT SIJ area during STW and had increased tightness in QL as well..  MPT  Assessed RT SIJ end of Rx.. To Surgeon for consult tomorrow.   Rehab Potential Excellent   PT Frequency 2x / week   PT Duration 6 weeks   PT Treatment/Interventions ADLs/Self Care Home Management;Cryotherapy;Electrical Stimulation;Moist Heat;Ultrasound;Patient/family  education;Neuromuscular re-education;Therapeutic exercise;Therapeutic activities;Manual techniques;Dry needling   PT Next Visit Plan cont with POC for core stabilization/US/STW/heat/ES as needed   RT SIJ   PT Home Exercise Plan [place], SKTOC. Adductor squeeze and hip flexion  isometrics   Consulted and Agree with Plan of Care Patient      Patient will benefit from skilled therapeutic intervention in order to improve the following deficits and impairments:  Pain, Impaired flexibility, Decreased strength, Postural dysfunction, Improper body mechanics, Difficulty walking  Visit Diagnosis: Acute right-sided low back pain with right-sided sciatica     Problem List Patient Active Problem List   Diagnosis Date Noted  . Pain in bone of jaw 11/25/2013  . Hyperlipidemia 09/24/2013  . Hypertension 09/24/2013  . GERD (gastroesophageal reflux disease) 09/24/2013   Mali Applegate MPT [facility] [address] [place], [place], [address] Phone: [phone]   Fax:   [phone]  Name: Isha Seefeld MRN: [id number] Date of Birth: [birth year]  PHYSICAL THERAPY DISCHARGE SUMMARY  Visits from Start of Care: 8.  Current functional level related to goals / functional outcomes: See above.   Remaining deficits: Patient did not return.   Education / Equipment: HEP. Plan: Patient agrees to discharge.  Patient goals were not met. Patient is being discharged due to not returning since the last visit.  ?????         Mali Applegate MPT

## 2016-07-10 ENCOUNTER — Encounter: Payer: BLUE CROSS/BLUE SHIELD | Admitting: Physical Therapy

## 2016-07-13 ENCOUNTER — Encounter: Payer: BLUE CROSS/BLUE SHIELD | Admitting: Physical Therapy

## 2016-07-17 ENCOUNTER — Ambulatory Visit: Payer: BLUE CROSS/BLUE SHIELD | Admitting: Physical Therapy

## 2017-07-01 ENCOUNTER — Ambulatory Visit: Payer: BLUE CROSS/BLUE SHIELD | Attending: Neurosurgery | Admitting: Physical Therapy

## 2017-07-01 ENCOUNTER — Other Ambulatory Visit: Payer: Self-pay

## 2017-07-01 ENCOUNTER — Encounter: Payer: Self-pay | Admitting: Physical Therapy

## 2017-07-01 DIAGNOSIS — R293 Abnormal posture: Secondary | ICD-10-CM | POA: Diagnosis present

## 2017-07-01 DIAGNOSIS — M5441 Lumbago with sciatica, right side: Secondary | ICD-10-CM | POA: Insufficient documentation

## 2017-07-01 DIAGNOSIS — G8929 Other chronic pain: Secondary | ICD-10-CM | POA: Diagnosis present

## 2017-07-01 DIAGNOSIS — M545 Low back pain: Secondary | ICD-10-CM | POA: Insufficient documentation

## 2017-07-01 NOTE — Therapy (Signed)
[facility] [address] [place], [place], [address] Phone: [phone]   Fax:  [phone]  Physical Therapy Evaluation  Patient Details  Name: Penny Velasquez MRN: [id number] Date of Birth: 09/19/1970 Referring Provider: Rockne Menghini MD.   Encounter Date: 07/01/2017  PT End of Session - 07/01/17 1615    Visit Number  1    Number of Visits  8    Date for PT Re-Evaluation  08/05/17    PT Start Time  0145    PT Stop Time  0236    PT Time Calculation (min)  51 min    Activity Tolerance  Patient tolerated treatment well    Behavior During Therapy  [facility] for tasks assessed/performed       Past Medical History:  Diagnosis Date  . GERD (gastroesophageal reflux disease)   . Hyperlipidemia   . Hypertension     Past Surgical History:  Procedure Laterality Date  . HEMORROIDECTOMY  2007  . Laprascopic abdominal surgery     exploratory    There were no vitals filed for this visit.   Subjective Assessment - 07/01/17 1629    Subjective  The patient returns to physical therapy with continued c/o right low back pain with some radiation of pain into her right lower leg.  She ratesher pain at an 8/10 increasing with prolonged walking, sitting and standing.  She had an orginal job related injury in 2004 and a bad flare-up in January of this year (2019).    Limitations  Sitting;Standing    How long can you sit comfortably?  30 min    Diagnostic tests  xray shows lumbar arthritis    Patient Stated Goals  Decrease pain.    Currently in Pain?  Yes    Pain Score  8     Pain Location  Back    Pain Orientation  Right    Pain Descriptors / Indicators  Aching;Discomfort    Pain Type  Chronic pain    Pain Radiating Towards  Into right LE.    Pain Onset  More than a month ago    Pain Frequency  Constant    Aggravating Factors   See above.    Pain Relieving Factors  See above.    Multiple Pain Sites  No         [facility] PT Assessment -  07/01/17 0001      Assessment   Medical Diagnosis  Sacroilitis.    Referring Provider  Rockne Menghini MD.    Onset Date/Surgical Date  -- 05/07/16(recent flare-up).      Precautions   Precautions  None      Balance Screen   Has the patient fallen in the past 6 months  No    Has the patient had a decrease in activity level because of a fear of falling?   No    Is the patient reluctant to leave their home because of a fear of falling?   No      Prior Function   Level of Independence  Independent      Posture/Postural Control   Posture Comments  Right pelvic posterior rotation.      ROM / Strength   AROM / PROM / Strength  AROM;Strength      AROM   Overall AROM Comments  Normal lumbar range of motion.      Strength   Overall Strength Comments  Bilateral LE strength 4+/5 to 5/5 for major muscle groups.  Palpation   Palpation comment  Very tender to palpation over right sacral-iliac and iliolumbar ligaments and his right QL is very taut to palpation.      Special Tests   Other special tests  Diminished bilateral LE DTR's; (-) SLR and minimal pain with right FABER test.  Positive Sacrum Thrust test.  Right LE shorter due to a right pelvic posterior rotation.      Ambulation/Gait   Gait Comments  WNL.             Objective measurements completed on examination: See above findings.      [facility] Adult PT Treatment/Exercise - 07/01/17 0001      Modalities   Modalities  Electrical Stimulation;Moist Heat      Moist Heat Therapy   Number Minutes Moist Heat  15 Minutes    Moist Heat Location  Lumbar Spine      Electrical Stimulation   Electrical Stimulation Location  Right SIJ/LB region.    Electrical Stimulation Action  Pre-mod.    Electrical Stimulation Parameters  80-150 Hz x 15 minutes.    Electrical Stimulation Goals  Pain                  PT Long Term Goals - 07/01/17 1724      PT LONG TERM GOAL #1   Title  I with HEP    Time  4     Period  Weeks    Status  New      PT LONG TERM GOAL #2   Title  Patient to be able to perform ADLS with 1-2/10 pain or less.    Time  4    Period  Weeks    Status  New      PT LONG TERM GOAL #3   Title  Patient able to sit > 30 min without having to change positions.    Time  4    Period  Weeks    Status  New      PT LONG TERM GOAL #4   Title  Patient able to self-correct leg length discrepancy.    Time  4    Period  Weeks    Status  New             Plan - 07/01/17 1710    Clinical Impression Statement  The patient presents to OPPT with c/o right sided low back pain that she reports began in 2004.  She is very tender over her right SIJ.  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  She has a positive Sacral Thrust test.  Her pain limits her from prolonged sitting, standing and walking.  She is not confident that she will be able to return to work.    Clinical Presentation  Evolving    Clinical Presentation due to:  Not improving.    Clinical Decision Making  Low    Rehab Potential  Good    PT Frequency  2x / week    PT Duration  4 weeks    PT Treatment/Interventions  ADLs/Self Care Home Management;Cryotherapy;Electrical Stimulation;Ultrasound;Moist Heat;Traction;Therapeutic activities;Therapeutic exercise;Patient/family education;Iontophoresis 4mg /ml Dexamethasone    PT Next Visit Plan  In prone:  Combo e'stim/U/S to right SIJ f/b STW/M and QL release technique.  Back stabilization exercises.    Consulted and Agree with Plan of Care  Patient       Patient will benefit from skilled therapeutic intervention in order to improve the following deficits  and impairments:  Decreased activity tolerance, Decreased strength, Postural dysfunction, Pain  Visit Diagnosis: Acute right-sided low back pain with right-sided sciatica - Plan: PT plan of care cert/re-cert  Abnormal posture - Plan: PT plan of care cert/re-cert     Problem List Patient Active  Problem List   Diagnosis Date Noted  . Pain in bone of jaw 11/25/2013  . Hyperlipidemia 09/24/2013  . Hypertension 09/24/2013  . GERD (gastroesophageal reflux disease) 09/24/2013    Adi Seales, [place] MPT 07/01/2017, 5:33 PM  [facility] [address] [place], [place], [address] Phone: [phone]   Fax:  [phone]  Name: Penny Velasquez MRN: [id number] Date of Birth: 12-Jun-1970

## 2017-07-03 ENCOUNTER — Encounter: Payer: Self-pay | Admitting: Physical Therapy

## 2017-07-03 ENCOUNTER — Ambulatory Visit: Payer: BLUE CROSS/BLUE SHIELD | Admitting: Physical Therapy

## 2017-07-03 DIAGNOSIS — M5441 Lumbago with sciatica, right side: Secondary | ICD-10-CM | POA: Diagnosis not present

## 2017-07-03 DIAGNOSIS — R293 Abnormal posture: Secondary | ICD-10-CM

## 2017-07-03 NOTE — Therapy (Signed)
[facility] [address] [place], [place], [address] Phone: [phone]   Fax:  [phone]  Physical Therapy Treatment  Patient Details  Name: Penny Velasquez MRN: [id number] Date of Birth: [birth year] Referring Provider: Rockne MenghiniMichaux Kilpatrick MD.   Encounter Date: 07/03/2017  PT End of Session - 07/03/17 1354    Visit Number  2    Number of Visits  8    Date for PT Re-Evaluation  08/05/17    PT Start Time  0106    PT Stop Time  0154    PT Time Calculation (min)  48 min       Past Medical History:  Diagnosis Date  . GERD (gastroesophageal reflux disease)   . Hyperlipidemia   . Hypertension     Past Surgical History:  Procedure Laterality Date  . HEMORROIDECTOMY  2007  . Laprascopic abdominal surgery     exploratory    There were no vitals filed for this visit.  Subjective Assessment - 07/03/17 1355    Subjective  Not too bad today.  My pain is around a 5/10 today.    Pain Score  5     Pain Location  Back    Pain Orientation  Right    Pain Descriptors / Indicators  Aching;Discomfort    Pain Type  Chronic pain    Pain Onset  More than a month ago                      [facility] Treatment/Exercise - 07/03/17 0001      Modalities   Modalities  Electrical Stimulation;Moist Heat;Ultrasound      Moist Heat Therapy   Number Minutes Moist Heat  15 Minutes    Moist Heat Location  -- Lumbar spine.      Programme researcher, broadcasting/film/videolectrical Stimulation   Electrical Stimulation Location  Right SIJ/Low back.    Electrical Stimulation Action  Pre-mod.    Electrical Stimulation Parameters  80-150 hz x 15 minutes.    Electrical Stimulation Goals  Pain      Ultrasound   Ultrasound Location  Right SIJ/Low back    Ultrasound Parameters  1.50 W/CM2 x 12 minutes.      Manual Therapy   Manual Therapy  Soft tissue mobilization    Manual therapy comments  STW/M x 11 minutes to left SIJ and left QL release technique.                   PT Long Term Goals - 07/01/17 1724      PT LONG TERM GOAL #1   Title  I with HEP    Time  4    Period  Weeks    Status  New      PT LONG TERM GOAL #2   Title  Patient to be able to perform ADLS with 1-2/10 pain or less.    Time  4    Period  Weeks    Status  New      PT LONG TERM GOAL #3   Title  Patient able to sit > 30 min without having to change positions.    Time  4    Period  Weeks    Status  New      PT LONG TERM GOAL #4   Title  Patient able to self-correct leg length discrepancy.    Time  4    Period  Weeks    Status  New  Plan - 07/03/17 1400    Clinical Impression Statement  Patient did well with treatment today though she was very tender to palpation over right. sacral iliac and iliolumbar ligament.       Patient will benefit from skilled therapeutic intervention in order to improve the following deficits and impairments:  Decreased activity tolerance, Decreased strength, Postural dysfunction, Pain  Visit Diagnosis: Acute right-sided low back pain with right-sided sciatica  Abnormal posture     Problem List Patient Active Problem List   Diagnosis Date Noted  . Pain in bone of jaw 11/25/2013  . Hyperlipidemia 09/24/2013  . Hypertension 09/24/2013  . GERD (gastroesophageal reflux disease) 09/24/2013    Lavere Stork, [place] MPT 07/03/2017, 2:02 PM  [facility] [address] [place], [place], [address] Phone: [phone]   Fax:  [phone]  Name: Penny Velasquez MRN: [id number] Date of Birth: [birth year]

## 2017-07-08 ENCOUNTER — Ambulatory Visit: Payer: BLUE CROSS/BLUE SHIELD | Admitting: Physical Therapy

## 2017-07-08 ENCOUNTER — Encounter: Payer: Self-pay | Admitting: Physical Therapy

## 2017-07-08 DIAGNOSIS — M5441 Lumbago with sciatica, right side: Secondary | ICD-10-CM

## 2017-07-08 DIAGNOSIS — R293 Abnormal posture: Secondary | ICD-10-CM

## 2017-07-08 NOTE — Therapy (Addendum)
[facility] [address] [place], [place], [address] Phone: [phone]   Fax:  [phone]  Physical Therapy Treatment  Patient Details  Name: Penny PaulaKimberly Goodroe MRN: [id number] Date of Birth: 06/05/1970 Referring Provider: Rockne MenghiniMichaux Kilpatrick MD.   Encounter Date: 07/08/2017  PT End of Session - 07/08/17 1431    Visit Number  3    Number of Visits  8    Date for PT Re-Evaluation  08/05/17    PT Start Time  1431    PT Stop Time  1519    PT Time Calculation (min)  48 min    Activity Tolerance  Patient tolerated treatment well    Behavior During Therapy  [facility] for tasks assessed/performed       Past Medical History:  Diagnosis Date  . GERD (gastroesophageal reflux disease)   . Hyperlipidemia   . Hypertension     Past Surgical History:  Procedure Laterality Date  . HEMORROIDECTOMY  2007  . Laprascopic abdominal surgery     exploratory    There were no vitals filed for this visit.  Subjective Assessment - 07/08/17 1429    Subjective  Reports that she has not been at work since 04/17/2017. Reports that she has not done much today. Reports that she fell yesterday as she says that she feels almost a delay in LE movement.    Limitations  Sitting;Standing    How long can you sit comfortably?  30 min    Diagnostic tests  xray shows lumbar arthritis    Patient Stated Goals  Decrease pain.    Currently in Pain?  Yes    Pain Score  6     Pain Location  Back    Pain Orientation  Right;Lower    Pain Descriptors / Indicators  Aching    Pain Type  Chronic pain    Pain Onset  More than a month ago    Pain Frequency  Constant    Aggravating Factors   Activity    Pain Relieving Factors  Rest         [facility] PT Assessment - 07/08/17 0001      Assessment   Medical Diagnosis  Sacroilitis.    Onset Date/Surgical Date  05/07/17    Next MD Visit  None      Precautions   Precautions  None                  [facility] Adult PT  Treatment/Exercise - 07/08/17 0001      Modalities   Modalities  Electrical Stimulation;Moist Heat;Ultrasound All modalities completed in prone over 2 pillows      Moist Heat Therapy   Number Minutes Moist Heat  15 Minutes    Moist Heat Location  Lumbar Spine      Electrical Stimulation   Electrical Stimulation Location  B low back    Electrical Stimulation Action  Pre-Mod    Electrical Stimulation Parameters  80-150 hz x15 min    Electrical Stimulation Goals  Pain      Ultrasound   Ultrasound Location  R SI/ low back    Ultrasound Parameters  1.5 w/cm2, 100%, 1 mhz x10 min    Ultrasound Goals  Pain      Manual Therapy   Manual Therapy  Soft tissue mobilization    Soft tissue mobilization  STW/M gently to R SI joint, QL, lumbar paraspinals to reduce muscle tightness and pain in prone over 2 pilows  PT Long Term Goals - 07/01/17 1724      PT LONG TERM GOAL #1   Title  I with HEP    Time  4    Period  Weeks    Status  New      PT LONG TERM GOAL #2   Title  Patient to be able to perform ADLS with 1-2/10 pain or less.    Time  4    Period  Weeks    Status  New      PT LONG TERM GOAL #3   Title  Patient able to sit > 30 min without having to change positions.    Time  4    Period  Weeks    Status  New      PT LONG TERM GOAL #4   Title  Patient able to self-correct leg length discrepancy.    Time  4    Period  Weeks    Status  New            Plan - 07/08/17 1511    Clinical Impression Statement  Patient arrived to clinic with reports of a recent fall in which she skinned her L elbow and that she fell on the concrete steps outside her home on her back. Upon observation light bruising noted along B low back approximately in L2-L4 region in which patient reported that that is the region which came in contact with the concrete steps. Patient very tender to palpation and manual therapy to R SI joint and surrounding musculature such as R  lumbar paraspinals and QL. Skin redness presented in R low back area due to manual therapy session. Entire treatment completed to prone over two pillows without complaint from patient regarding pain from positioning. Patient rested predominately with pillow under her chest and resting on her elbows. Patient experiences occasional muscle twitching but reports that she feels as if there is a delay in processing from brain to LE for movement as her brain is wanting LE movement and her LE does not move. Normal modalities response noted following removal of the modalities.     Rehab Potential  Good    PT Frequency  2x / week    PT Duration  4 weeks    PT Treatment/Interventions  ADLs/Self Care Home Management;Cryotherapy;Electrical Stimulation;Ultrasound;Moist Heat;Traction;Therapeutic activities;Therapeutic exercise;Patient/family education;Iontophoresis 4mg /ml Dexamethasone    PT Next Visit Plan  In prone:  Combo e'stim/U/S to right SIJ f/b STW/M and QL release technique.  Back stabilization exercises.    Consulted and Agree with Plan of Care  Patient       Patient will benefit from skilled therapeutic intervention in order to improve the following deficits and impairments:  Decreased activity tolerance, Decreased strength, Postural dysfunction, Pain  Visit Diagnosis: Acute right-sided low back pain with right-sided sciatica  Abnormal posture     Problem List Patient Active Problem List   Diagnosis Date Noted  . Pain in bone of jaw 11/25/2013  . Hyperlipidemia 09/24/2013  . Hypertension 09/24/2013  . GERD (gastroesophageal reflux disease) 09/24/2013    Marvell Fuller, PTA 07/08/2017, 3:26 PM  [facility] [address] [place], [place], [address] Phone: [phone]   Fax:  [phone]  Name: Emelly Wurtz MRN: [id number] Date of Birth: 31-May-1970

## 2017-07-11 ENCOUNTER — Ambulatory Visit: Payer: BLUE CROSS/BLUE SHIELD | Admitting: Physical Therapy

## 2017-07-11 ENCOUNTER — Encounter: Payer: Self-pay | Admitting: Physical Therapy

## 2017-07-11 DIAGNOSIS — G8929 Other chronic pain: Secondary | ICD-10-CM

## 2017-07-11 DIAGNOSIS — M5441 Lumbago with sciatica, right side: Secondary | ICD-10-CM

## 2017-07-11 DIAGNOSIS — M545 Low back pain: Secondary | ICD-10-CM

## 2017-07-11 NOTE — Therapy (Signed)
[facility] [address] [place], [place], [address] Phone: [phone]   Fax:  [phone]  Physical Therapy Treatment  Patient Details  Name: Penny Velasquez MRN: [id number] Date of Birth: 08-20-1970 Referring Provider: Rockne MenghiniMichaux Kilpatrick MD.   Encounter Date: 07/11/2017  PT End of Session - 07/11/17 1541    Visit Number  4    Number of Visits  8    Date for PT Re-Evaluation  08/05/17    PT Start Time  0230    PT Stop Time  0321    PT Time Calculation (min)  51 min       Past Medical History:  Diagnosis Date  . GERD (gastroesophageal reflux disease)   . Hyperlipidemia   . Hypertension     Past Surgical History:  Procedure Laterality Date  . HEMORROIDECTOMY  2007  . Laprascopic abdominal surgery     exploratory    There were no vitals filed for this visit.  Subjective Assessment - 07/11/17 1542    Subjective  My pain is a 5/10 today.    Pain Score  5     Pain Location  Back    Pain Orientation  Right;Lower    Pain Descriptors / Indicators  Aching    Pain Onset  More than a month ago                      [facility] Treatment/Exercise - 07/11/17 0001      Modalities   Modalities  Electrical Stimulation;Moist Heat;Ultrasound      Moist Heat Therapy   Number Minutes Moist Heat  20 Minutes    Moist Heat Location  Lumbar Spine      Electrical Stimulation   Electrical Stimulation Location  Right SIJ/Right low back region.    Electrical Stimulation Action  Pre-mod.    Electrical Stimulation Parameters  80-150 Hz x 20 mniutes.    Electrical Stimulation Goals  Pain      Ultrasound   Ultrasound Location  Right SIJ/Right low back region.    Ultrasound Parameters  Combo e'stim/U/S at 1.50 W/CM2 x 12 minutes.    Ultrasound Goals  Pain      Manual Therapy   Manual Therapy  Soft tissue mobilization    Soft tissue mobilization  STW/M to right lower lumbar musculature and SIJ x 11 minutes including  right QL release technique.                  PT Long Term Goals - 07/01/17 1724      PT LONG TERM GOAL #1   Title  I with HEP    Time  4    Period  Weeks    Status  New      PT LONG TERM GOAL #2   Title  Patient to be able to perform ADLS with 1-2/10 pain or less.    Time  4    Period  Weeks    Status  New      PT LONG TERM GOAL #3   Title  Patient able to sit > 30 min without having to change positions.    Time  4    Period  Weeks    Status  New      PT LONG TERM GOAL #4   Title  Patient able to self-correct leg length discrepancy.    Time  4    Period  Weeks    Status  New            Plan - 07/11/17 1545    Clinical Impression Statement  Patient still very tender to palpation over right SIJ and tender to palpation over right QL today.  She tolerated treatment well.       Patient will benefit from skilled therapeutic intervention in order to improve the following deficits and impairments:     Visit Diagnosis: Acute right-sided low back pain with right-sided sciatica  Chronic right-sided low back pain, with sciatica presence unspecified     Problem List Patient Active Problem List   Diagnosis Date Noted  . Pain in bone of jaw 11/25/2013  . Hyperlipidemia 09/24/2013  . Hypertension 09/24/2013  . GERD (gastroesophageal reflux disease) 09/24/2013    Jaicob Dia, [place] MPT 07/11/2017, 3:48 PM  [facility] [address] [place], [place], [address] Phone: [phone]   Fax:  [phone]  Name: Penny Velasquez MRN: [id number] Date of Birth: [birth year]

## 2017-07-16 ENCOUNTER — Ambulatory Visit: Payer: BLUE CROSS/BLUE SHIELD | Admitting: Physical Therapy

## 2017-07-16 ENCOUNTER — Encounter: Payer: Self-pay | Admitting: Physical Therapy

## 2017-07-16 DIAGNOSIS — G8929 Other chronic pain: Secondary | ICD-10-CM

## 2017-07-16 DIAGNOSIS — M545 Low back pain: Principal | ICD-10-CM

## 2017-07-16 DIAGNOSIS — M5441 Lumbago with sciatica, right side: Secondary | ICD-10-CM

## 2017-07-16 DIAGNOSIS — R293 Abnormal posture: Secondary | ICD-10-CM

## 2017-07-16 NOTE — Therapy (Signed)
[facility] [address] [place], [place], [address] Phone: [phone]   Fax:  [phone]  Physical Therapy Treatment  Patient Details  Name: Penny Velasquez MRN: [id number] Date of Birth: 1970-05-19 Referring Provider: Rockne MenghiniMichaux Kilpatrick MD.   Encounter Date: 07/16/2017  PT End of Session - 07/16/17 1530    Visit Number  5    Number of Visits  8    Date for PT Re-Evaluation  08/05/17    PT Start Time  0230    PT Stop Time  0322    PT Time Calculation (min)  52 min    Activity Tolerance  Patient tolerated treatment well    Behavior During Therapy  [facility] for tasks assessed/performed       Past Medical History:  Diagnosis Date  . GERD (gastroesophageal reflux disease)   . Hyperlipidemia   . Hypertension     Past Surgical History:  Procedure Laterality Date  . HEMORROIDECTOMY  2007  . Laprascopic abdominal surgery     exploratory    There were no vitals filed for this visit.  Subjective Assessment - 07/16/17 1513    Subjective  My pain is a 9/10.  I walked in [employer] and before long I was limping.  I feel good for a couple of days after treatment but it doesn't last.    Pain Score  9     Pain Location  Back    Pain Orientation  Right;Left    Pain Type  Chronic pain    Pain Onset  More than a month ago                No data recorded       [facility] Adult PT Treatment/Exercise - 07/16/17 0001      Modalities   Modalities  Electrical Stimulation;Moist Heat;Ultrasound      Moist Heat Therapy   Number Minutes Moist Heat  20 Minutes    Moist Heat Location  Lumbar Spine      Electrical Stimulation   Electrical Stimulation Location  Right SIJ/Right low back.    Electrical Stimulation Action  Pre-mod.    Electrical Stimulation Parameters  80-150 Hz x 20 minutes.    Electrical Stimulation Goals  Pain      Ultrasound   Ultrasound Location  Right SIJ/Right low back    Ultrasound Parameters  Combo e'stim/U/S  at 1.50 w/CM2 x 12 minutes.    Ultrasound Goals  Pain      Manual Therapy   Manual Therapy  Soft tissue mobilization    Soft tissue mobilization  STW/M x 11 minutes.             PT Education - 07/16/17 1509    Education provided  Yes    Education Details  HEP.    Person(s) Educated  Patient    Methods  Handout    Comprehension  Verbalized understanding          PT Long Term Goals - 07/01/17 1724      PT LONG TERM GOAL #1   Title  I with HEP    Time  4    Period  Weeks    Status  New      PT LONG TERM GOAL #2   Title  Patient to be able to perform ADLS with 1-2/10 pain or less.    Time  4    Period  Weeks    Status  New  PT LONG TERM GOAL #3   Title  Patient able to sit > 30 min without having to change positions.    Time  4    Period  Weeks    Status  New      PT LONG TERM GOAL #4   Title  Patient able to self-correct leg length discrepancy.    Time  4    Period  Weeks    Status  New            Plan - 07/16/17 1528    Clinical Impression Statement  Patient reporting just temporary pain relief after treatments.  Very tender today over right SIJ region.    PT Next Visit Plan  In prone:  Combo e'stim/U/S to right SIJ f/b STW/M and QL release technique.  Back stabilization exercises.    Consulted and Agree with Plan of Care  Patient       Patient will benefit from skilled therapeutic intervention in order to improve the following deficits and impairments:  Decreased activity tolerance, Decreased strength, Postural dysfunction, Pain  Visit Diagnosis: Chronic right-sided low back pain, with sciatica presence unspecified  Acute right-sided low back pain with right-sided sciatica  Abnormal posture     Problem List Patient Active Problem List   Diagnosis Date Noted  . Pain in bone of jaw 11/25/2013  . Hyperlipidemia 09/24/2013  . Hypertension 09/24/2013  . GERD (gastroesophageal reflux disease) 09/24/2013    Aldous Housel, [place]  MPT 07/16/2017, 3:32 PM  [facility] [address] [place], [place], [address] Phone: [phone]   Fax:  [phone]  Name: Penny Velasquez MRN: [id number] Date of Birth: 1971-03-28

## 2017-07-18 ENCOUNTER — Ambulatory Visit: Payer: BLUE CROSS/BLUE SHIELD | Admitting: Physical Therapy

## 2017-07-18 ENCOUNTER — Encounter: Payer: Self-pay | Admitting: Physical Therapy

## 2017-07-18 DIAGNOSIS — M5441 Lumbago with sciatica, right side: Secondary | ICD-10-CM | POA: Diagnosis not present

## 2017-07-18 DIAGNOSIS — M545 Low back pain: Principal | ICD-10-CM

## 2017-07-18 DIAGNOSIS — G8929 Other chronic pain: Secondary | ICD-10-CM

## 2017-07-18 NOTE — Therapy (Signed)
[facility] [address] [place], [place], [address] Phone: [phone]   Fax:  [phone]  Physical Therapy Treatment  Patient Details  Name: Penny Velasquez MRN: [id number] Date of Birth: May 16, 1970 Referring Provider: Rockne Menghini MD.   Encounter Date: 07/18/2017  PT End of Session - 07/18/17 1502    Visit Number  6    Number of Visits  8    Date for PT Re-Evaluation  08/05/17    PT Start Time  0230    PT Stop Time  0319    PT Time Calculation (min)  49 min    Activity Tolerance  Patient tolerated treatment well    Behavior During Therapy  [facility] for tasks assessed/performed       Past Medical History:  Diagnosis Date  . GERD (gastroesophageal reflux disease)   . Hyperlipidemia   . Hypertension     Past Surgical History:  Procedure Laterality Date  . HEMORROIDECTOMY  2007  . Laprascopic abdominal surgery     exploratory    There were no vitals filed for this visit.  Subjective Assessment - 07/18/17 1502    Subjective  My back still hurts a lot and my right knee is bothering me.  I've had knee pain for quite awhile which I think is related to my back but it's getting worse.    Pain Score  9     Pain Location  Back    Pain Orientation  Right;Left    Pain Descriptors / Indicators  Aching                No data recorded       [facility] Adult PT Treatment/Exercise - 07/18/17 0001      Moist Heat Therapy   Number Minutes Moist Heat  20 Minutes    Moist Heat Location  Lumbar Spine      Electrical Stimulation   Electrical Stimulation Location  Right SIJ/Right low back.    Electrical Stimulation Action  Pre-mod.    Electrical Stimulation Parameters  80-150 Hz 20 minutes.    Electrical Stimulation Goals  Pain      Ultrasound   Ultrasound Location  Right SIJ/Right low back.    Ultrasound Parameters  Combo e'stim/at 1.50 x 12 minutes.    Ultrasound Goals  Pain      Manual Therapy   Manual Therapy   Soft tissue mobilization    Soft tissue mobilization  STW/M x 12 minutes.                  PT Long Term Goals - 07/01/17 1724      PT LONG TERM GOAL #1   Title  I with HEP    Time  4    Period  Weeks    Status  New      PT LONG TERM GOAL #2   Title  Patient to be able to perform ADLS with 1-2/10 pain or less.    Time  4    Period  Weeks    Status  New      PT LONG TERM GOAL #3   Title  Patient able to sit > 30 min without having to change positions.    Time  4    Period  Weeks    Status  New      PT LONG TERM GOAL #4   Title  Patient able to self-correct leg length discrepancy.    Time  4  Period  Weeks    Status  New            Plan - 07/18/17 1529    Clinical Impression Statement  Patient reporting temporary relief of pain after tretaments.  She continues to be very tender ib her right low back and SIJ.    PT Treatment/Interventions  ADLs/Self Care Home Management;Cryotherapy;Electrical Stimulation;Ultrasound;Moist Heat;Traction;Therapeutic activities;Therapeutic exercise;Patient/family education;Iontophoresis 4mg /ml Dexamethasone    PT Next Visit Plan  In prone:  Combo e'stim/U/S to right SIJ f/b STW/M and QL release technique.  Back stabilization exercises.    Consulted and Agree with Plan of Care  Patient       Patient will benefit from skilled therapeutic intervention in order to improve the following deficits and impairments:  Decreased activity tolerance, Decreased strength, Postural dysfunction, Pain  Visit Diagnosis: Chronic right-sided low back pain, with sciatica presence unspecified     Problem List Patient Active Problem List   Diagnosis Date Noted  . Pain in bone of jaw 11/25/2013  . Hyperlipidemia 09/24/2013  . Hypertension 09/24/2013  . GERD (gastroesophageal reflux disease) 09/24/2013    Girard Koontz, [place] MPT 07/18/2017, 3:34 PM  [facility] [address] [place], [place],  [id number] Phone: [phone]   Fax:  [phone]  Name: Penny Velasquez MRN: [id number] Date of Birth: 06-16-1970

## 2017-07-23 ENCOUNTER — Ambulatory Visit: Payer: BLUE CROSS/BLUE SHIELD | Attending: Neurosurgery | Admitting: Physical Therapy

## 2017-07-23 ENCOUNTER — Encounter: Payer: Self-pay | Admitting: Physical Therapy

## 2017-07-23 DIAGNOSIS — R293 Abnormal posture: Secondary | ICD-10-CM | POA: Diagnosis present

## 2017-07-23 DIAGNOSIS — G8929 Other chronic pain: Secondary | ICD-10-CM | POA: Insufficient documentation

## 2017-07-23 DIAGNOSIS — M545 Low back pain: Secondary | ICD-10-CM | POA: Diagnosis present

## 2017-07-23 DIAGNOSIS — M5441 Lumbago with sciatica, right side: Secondary | ICD-10-CM | POA: Insufficient documentation

## 2017-07-23 NOTE — Therapy (Signed)
[facility] [address] [place], [place], [address] Phone: [phone]   Fax:  [phone]  Physical Therapy Treatment  Patient Details  Name: Penny Velasquez MRN: [id number] Date of Birth: October 11, 1970 Referring Provider: Rockne MenghiniMichaux Kilpatrick MD.   Encounter Date: 07/23/2017  PT End of Session - 07/23/17 1708    Visit Number  7    Number of Visits  8    Date for PT Re-Evaluation  08/05/17    PT Start Time  0230    PT Stop Time  0319    PT Time Calculation (min)  49 min    Activity Tolerance  Patient tolerated treatment well    Behavior During Therapy  [facility] for tasks assessed/performed       Past Medical History:  Diagnosis Date  . GERD (gastroesophageal reflux disease)   . Hyperlipidemia   . Hypertension     Past Surgical History:  Procedure Laterality Date  . HEMORROIDECTOMY  2007  . Laprascopic abdominal surgery     exploratory    There were no vitals filed for this visit.  Subjective Assessment - 07/23/17 1709    Subjective  I'm getting an injection this week.  I was in severe pain over the wekkend just from doing basic housework.  Pain better today.    Pain Score  5     Pain Location  Back    Pain Orientation  Right    Pain Type  Chronic pain    Pain Onset  More than a month ago                       [facility] - 07/23/17 0001      Modalities   Modalities  Electrical Stimulation;Moist Heat      Moist Heat Therapy   Moist Heat Location  Lumbar Spine      Electrical Stimulation   Electrical Stimulation Location  Right SIJ/Right low back.    Electrical Stimulation Action  Pre-mod.    Electrical Stimulation Parameters  80-150 Hz x 20 minutes.    Electrical Stimulation Goals  Pain      Ultrasound   Ultrasound Location  Right SIJ and entire right lumbar region    Ultrasound Parameters  1.50 W/CM2 x 15 minutes.    Ultrasound Goals  Pain      Manual Therapy   Manual  Therapy  Soft tissue mobilization    Soft tissue mobilization  STW/M focused on right QL release x 8 minutes.                  PT Long Term Goals - 07/01/17 1724      PT LONG TERM GOAL #1   Title  I with HEP    Time  4    Period  Weeks    Status  New      PT LONG TERM GOAL #2   Title  Patient to be able to perform ADLS with 1-2/10 pain or less.    Time  4    Period  Weeks    Status  New      PT LONG TERM GOAL #3   Title  Patient able to sit > 30 min without having to change positions.    Time  4    Period  Weeks    Status  New      PT LONG TERM GOAL #4   Title  Patient able to self-correct  leg length discrepancy.    Time  4    Period  Weeks    Status  New            Plan - 07/23/17 1729    Clinical Impression Statement  Temporary relief of pain from treatments.  Pain flare up over weekend by performing basic ADL's.  Discharge planned for Thursday, 07/25/17.    PT Treatment/Interventions  ADLs/Self Care Home Management;Cryotherapy;Electrical Stimulation;Ultrasound;Moist Heat;Traction;Therapeutic activities;Therapeutic exercise;Patient/family education;Iontophoresis 4mg /ml Dexamethasone    PT Next Visit Plan  In prone:  Combo e'stim/U/S to right SIJ f/b STW/M and QL release technique.  Back stabilization exercises.    Consulted and Agree with Plan of Care  Patient       Patient will benefit from skilled therapeutic intervention in order to improve the following deficits and impairments:  Decreased activity tolerance, Decreased strength, Postural dysfunction, Pain  Visit Diagnosis: Chronic right-sided low back pain, with sciatica presence unspecified  Acute right-sided low back pain with right-sided sciatica  Abnormal posture     Problem List Patient Active Problem List   Diagnosis Date Noted  . Pain in bone of jaw 11/25/2013  . Hyperlipidemia 09/24/2013  . Hypertension 09/24/2013  . GERD (gastroesophageal reflux disease) 09/24/2013     Adela Esteban, [place] MPT 07/23/2017, 5:32 PM  [facility] [address] [place], [place], [address] Phone: [phone]   Fax:  [phone]  Name: Penny Velasquez MRN: [id number] Date of Birth: 02-Jun-1970

## 2017-07-25 ENCOUNTER — Ambulatory Visit: Payer: BLUE CROSS/BLUE SHIELD | Admitting: Physical Therapy

## 2017-07-25 ENCOUNTER — Encounter: Payer: Self-pay | Admitting: Physical Therapy

## 2017-07-25 DIAGNOSIS — R293 Abnormal posture: Secondary | ICD-10-CM

## 2017-07-25 DIAGNOSIS — G8929 Other chronic pain: Secondary | ICD-10-CM

## 2017-07-25 DIAGNOSIS — M5441 Lumbago with sciatica, right side: Secondary | ICD-10-CM

## 2017-07-25 DIAGNOSIS — M545 Low back pain: Principal | ICD-10-CM

## 2017-07-25 NOTE — Therapy (Signed)
[facility] [place], [place], [address] Phone: [phone]   Fax:  [phone]  Physical Therapy Treatment  Patient Details  Name: Penny Velasquez MRN: [id number] Date of Birth: Nov 20, 1970 Referring Provider: Lavell Anchors MD.   Encounter Date: 07/25/2017  PT End of Session - 07/25/17 1549    Visit Number  8    Number of Visits  8    Date for PT Re-Evaluation  08/05/17    PT Start Time  0230    PT Stop Time  0321    PT Time Calculation (min)  51 min    Activity Tolerance  Patient tolerated treatment well    Behavior During Therapy  [facility] for tasks assessed/performed       Past Medical History:  Diagnosis Date  . GERD (gastroesophageal reflux disease)   . Hyperlipidemia   . Hypertension     Past Surgical History:  Procedure Laterality Date  . HEMORROIDECTOMY  2007  . Laprascopic abdominal surgery     exploratory    There were no vitals filed for this visit.  Subjective Assessment - 07/25/17 1523    Subjective  I went to the doctor for a consult for the injection then I go back on the 19th to have it done.  my pain is an 8/10.    Pain Score  8     Pain Orientation  Right    Pain Descriptors / Indicators  Aching    Pain Type  Chronic pain    Pain Onset  More than a month ago                       [facility] Adult PT Treatment/Exercise - 07/25/17 0001      Modalities   Modalities  Electrical Stimulation;Moist Heat      Moist Heat Therapy   Number Minutes Moist Heat  20 Minutes    Moist Heat Location  Lumbar Spine      Electrical Stimulation   Electrical Stimulation Location  Right SIJ/Right low back    Electrical Stimulation Action  Pre-mod    Electrical Stimulation Parameters  80-150 hz x 20 minutes.    Electrical Stimulation Goals  Pain      Ultrasound   Ultrasound Location  Right SIJ/Right low back.    Ultrasound Parameters  1.50 W/CM2 x 15 minutes.    Ultrasound Goals  Pain       Manual Therapy   Manual Therapy  Soft tissue mobilization    Soft tissue mobilization  STW/M x 8 minutes to right low back and sacrall ligaments.                  PT Long Term Goals - 07/01/17 1724      PT LONG TERM GOAL #1   Title  I with HEP    Time  4    Period  Weeks    Status  New      PT LONG TERM GOAL #2   Title  Patient to be able to perform ADLS with 0-0/86 pain or less.    Time  4    Period  Weeks    Status  New      PT LONG TERM GOAL #3   Title  Patient able to sit > 30 min without having to change positions.    Time  4    Period  Weeks    Status  New  PT LONG TERM GOAL #4   Title  Patient able to self-correct leg length discrepancy.    Time  4    Period  Weeks    Status  New              Patient will benefit from skilled therapeutic intervention in order to improve the following deficits and impairments:     Visit Diagnosis: Chronic right-sided low back pain, with sciatica presence unspecified  Acute right-sided low back pain with right-sided sciatica  Abnormal posture     Problem List Patient Active Problem List   Diagnosis Date Noted  . Pain in bone of jaw 11/25/2013  . Hyperlipidemia 09/24/2013  . Hypertension 09/24/2013  . GERD (gastroesophageal reflux disease) 09/24/2013   PHYSICAL THERAPY DISCHARGE SUMMARY  Visits from Start of Care: 8.  Current functional level related to goals / functional outcomes: See above.   Remaining deficits: No goals met.  Patient continues to report a great deal of pain in her right SIJ.   Education / Equipment: HEP. Plan: Patient agrees to discharge.  Patient goals were not met. Patient is being discharged due to lack of progress.  ?????      Jareth Pardee, [place]  MPT 07/25/2017, 4:37 PM  [facility] [address] [place], [place], [address] Phone: [phone]   Fax:  [phone]  Name: Penny Velasquez MRN: [id number] Date of  Birth: 11-Nov-1970

## 2017-10-02 ENCOUNTER — Telehealth (HOSPITAL_COMMUNITY): Payer: Self-pay | Admitting: Family Medicine

## 2017-10-02 NOTE — Telephone Encounter (Signed)
10/02/17 11:10am Called due to referral from Dr. Kathlen BrunswickMarsha White - left msg for pt to call/sh
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
## 2017-11-07 ENCOUNTER — Ambulatory Visit (INDEPENDENT_AMBULATORY_CARE_PROVIDER_SITE_OTHER): Payer: BLUE CROSS/BLUE SHIELD | Admitting: Psychiatry

## 2017-11-07 ENCOUNTER — Encounter (HOSPITAL_COMMUNITY): Payer: Self-pay | Admitting: Psychiatry

## 2017-11-07 VITALS — BP 135/91 | HR 96 | Ht 66.75 in | Wt 263.0 lb

## 2017-11-07 DIAGNOSIS — F331 Major depressive disorder, recurrent, moderate: Secondary | ICD-10-CM | POA: Diagnosis not present

## 2017-11-07 MED ORDER — BUPROPION HCL ER (XL) 150 MG PO TB24: 450.0000 mg | ORAL_TABLET | Freq: Every day | ORAL | 0 refills | Status: DC

## 2017-11-07 MED ORDER — SERTRALINE HCL 50 MG PO TABS: ORAL_TABLET | ORAL | 0 refills | Status: DC

## 2017-11-07 NOTE — Patient Instructions (Addendum)
1. Increase Wellbutrin 450 mg daily  2. Return to clinic in one month for 30 mins 3. Referral to therapy

## 2017-11-12 ENCOUNTER — Ambulatory Visit (INDEPENDENT_AMBULATORY_CARE_PROVIDER_SITE_OTHER): Payer: BLUE CROSS/BLUE SHIELD | Admitting: Psychiatry

## 2017-11-12 ENCOUNTER — Encounter (HOSPITAL_COMMUNITY): Payer: Self-pay | Admitting: Psychiatry

## 2017-11-12 DIAGNOSIS — F331 Major depressive disorder, recurrent, moderate: Secondary | ICD-10-CM | POA: Diagnosis not present
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
## 2017-12-12 NOTE — Progress Notes (Signed)
BH MD/PA/NP OP Progress Note  12/16/2017 1:35 PM Tyliyah Mcmeekin  MRN:  [id number]  Chief Complaint:  Chief Complaint    Depression; Follow-up     HPI:  Patient presents for follow-up appointment for depression.  She states that she returned to work as her short-term disability was expired.  She is currently doing part-time as she cannot work full-time due to her back pain.  She talks about her cousin in [place], who was admitted to the hospital.  This made her to think that she would like to move to [place], although she does not know how due to her financial strain.  She still lives at her grandparents house.  She does have nobody to help the patient.  She notices that she might be doing a little better as she has less difficulty in trying to look positives.  She has insomnia.  She feels fatigue.  She has fair concentration.  She denies SI.  She has anxiety.  She has occasional panic attacks.  She takes diazepam mainly for her pain, although she may use it for anxiety as well. She finds her cats to be very helpful and will feel worse if she does not have one.   Wt Readings from Last 3 Encounters:  12/16/17 260 lb (117.9 kg)  11/07/17 263 lb (119.3 kg)  12/18/14 250 lb (113.4 kg)    Visit Diagnosis:    ICD-10-CM   1. MDD (major depressive disorder), recurrent episode, moderate (HCC) F33.1     Past Psychiatric History: Please see initial evaluation for full details. I have reviewed the history. No updates at this time.     Past Medical History:  Past Medical History:  Diagnosis Date  . GERD (gastroesophageal reflux disease)   . Hyperlipidemia   . Hypertension     Past Surgical History:  Procedure Laterality Date  . HEMORROIDECTOMY  2007  . Laprascopic abdominal surgery     exploratory    Family Psychiatric History: Please see initial evaluation for full details. I have reviewed the history. No updates at this time.     Family History:  Family History   Problem Relation Age of Onset  . Anemia Mother   . Hypertension Mother   . Hypertension Father   . Heart disease Father   . Cancer Father        prostate    Social History:  Social History   Socioeconomic History  . Marital status: Single    Spouse name: Not on file  . Number of children: 0  . Years of education: Not on file  . Highest education level: High school graduate  Occupational History  . Not on file  Social Needs  . Financial resource strain: Very hard  . Food insecurity:    Worry: Sometimes true    Inability: Sometimes true  . Transportation needs:    Medical: Not on file    Non-medical: Not on file  Tobacco Use  . Smoking status: Former Smoker    Types: Cigarettes    Last attempt to quit: 09/25/2006    Years since quitting: 11.2  . Smokeless tobacco: Never Used  Substance and Sexual Activity  . Alcohol use: No  . Drug use: No  . Sexual activity: Never  Lifestyle  . Physical activity:    Days per week: Not on file    Minutes per session: Not on file  . Stress: Not on file  Relationships  . Social connections:    Talks on  phone: Not on file    Gets together: Not on file    Attends religious service: Never    Active member of club or organization: Not on file    Attends meetings of clubs or organizations: Not on file    Relationship status: Not on file  Other Topics Concern  . Not on file  Social History Narrative  . Not on file    Allergies:  Allergies  Allergen Reactions  . Penicillins Swelling    JOINT SWELLING AS CHILD    Metabolic Disorder Labs: No results found for: HGBA1C, MPG No results found for: PROLACTIN Lab Results  Component Value Date   CHOL 180 09/24/2013   TRIG 198 (H) 09/24/2013   HDL 45 09/24/2013   CHOLHDL 4.0 09/24/2013   LDLCALC 95 09/24/2013   No results found for: TSH  Therapeutic Level Labs: No results found for: LITHIUM No results found for: VALPROATE No components found for:  CBMZ  Current  Medications: Current Outpatient Medications  Medication Sig Dispense Refill  . amLODipine (NORVASC) 10 MG tablet Take 10 mg by mouth daily.    Marland Kitchen. aspirin-acetaminophen-caffeine (EXCEDRIN MIGRAINE) 250-250-65 MG per tablet Take by mouth every 6 (six) hours as needed for headache.    Marland Kitchen. azithromycin (ZITHROMAX) 250 MG tablet Take two tabs 1st day, then one q day x 4 6 each 0  . buPROPion (WELLBUTRIN XL) 150 MG 24 hr tablet Take 3 tablets (450 mg total) by mouth daily. 90 tablet 0  . cloNIDine (CATAPRES) 0.1 MG tablet Take 0.1 mg by mouth at bedtime.    . diazepam (VALIUM) 10 MG tablet Take 10 mg by mouth every 6 (six) hours as needed for anxiety.    . diclofenac sodium (VOLTAREN) 1 % GEL Apply topically 4 (four) times daily.    Marland Kitchen. esomeprazole (NEXIUM) 40 MG capsule Take 40 mg by mouth daily at 12 noon.    . hydrochlorothiazide (HYDRODIURIL) 25 MG tablet Take 25 mg by mouth daily.    Marland Kitchen. labetalol (NORMODYNE) 100 MG tablet TAKE ONE TABLET BY MOUTH TWICE DAILY 60 tablet 2  . losartan (COZAAR) 50 MG tablet Take 1 tablet (50 mg total) by mouth daily. 30 tablet 6  . lovastatin (MEVACOR) 20 MG tablet Take 1 tablet (20 mg total) by mouth at bedtime. 30 tablet 6  . meloxicam (MOBIC) 15 MG tablet Take 15 mg by mouth daily.    . methocarbamol (ROBAXIN) 500 MG tablet Take 500 mg by mouth 4 (four) times daily.    . Metoprolol-Hydrochlorothiazide 50-12.5 MG TB24 Take by mouth.    . Omega-3 Fatty Acids (FISH OIL CONCENTRATE PO) Take 12 capsules by mouth 2 (two) times daily.     Marland Kitchen. oxyCODONE (OXY IR/ROXICODONE) 5 MG immediate release tablet Take 1 tablet (5 mg total) by mouth every 4 (four) hours as needed for severe pain. 12 tablet 0  . pantoprazole (PROTONIX) 40 MG tablet TAKE ONE TABLET BY MOUTH ONCE DAILY 30 tablet 0  . Prenatal Multivit-Min-Fe-FA (PRENATAL VITAMINS PO) Take by mouth.    . sertraline (ZOLOFT) 50 MG tablet Start 25 mg daily for one week, then 50 mg daily 30 tablet 0   No current  facility-administered medications for this visit.      Musculoskeletal: Strength & Muscle Tone: within normal limits Gait & Station: normal Patient leans: N/A  Psychiatric Specialty Exam: Review of Systems  Psychiatric/Behavioral: Positive for depression. Negative for hallucinations, memory loss, substance abuse and suicidal ideas. The patient is  nervous/anxious and has insomnia.   All other systems reviewed and are negative.   Blood pressure 125/85, pulse 88, height 5\' 6"  (1.676 m), weight 260 lb (117.9 kg), SpO2 96 %.Body mass index is 41.97 kg/m.  General Appearance: Fairly Groomed  Eye Contact:  Good  Speech:  Clear and Coherent  Volume:  Normal  Mood:  Anxious and Depressed  Affect:  Appropriate, Congruent and Tearful  Thought Process:  Coherent  Orientation:  Full (Time, Place, and Person)  Thought Content: Logical   Suicidal Thoughts:  No  Homicidal Thoughts:  No  Memory:  Immediate;   Good  Judgement:  Good  Insight:  Present  Psychomotor Activity:  Normal  Concentration:  Concentration: Good and Attention Span: Good  Recall:  Good  Fund of Knowledge: Fair  Language: Good  Akathisia:  No  Handed:  Right  AIMS (if indicated): not done  Assets:  Communication Skills Desire for Improvement  ADL's:  Intact  Cognition: WNL  Sleep:  Poor   Screenings:   Assessment and Plan:  Riti Rollyson is a 47 y.o. year old female with a history of depression, r/o PCOS, hyperlipidemia, who presents for follow up appointment for MDD (major depressive disorder), recurrent episode, moderate (HCC)  # MDD, moderate, recurrent without psychotic features There has been slight improvement in neurovegetative symptoms since up titration of Wellbutrin.  Will continue current dose of Wellbutrin to target depression.  She has no known history of seizure.  Will add sertraline to target depression and anxiety.  Discussed risk of GI side effect.  Noted that she may not be a good candidate  for SNRI due to hypertension, although she may benefit for her pain.  She does have very low self-esteem, which she attributes to her trauma history.  She is encouraged to continue her therapy.   Plan I have reviewed and updated plans as below 1. Start sertraline 25 mg daily for one week, then 50 mg daily 2. Continue Wellbutrin 450 mg daily  3. Return to clinic in one month for 15 mins - letter to support to have emotional cats to be provided once she locates the place.  The patient demonstrates the following risk factors for suicide: Chronic risk factors for suicide include: psychiatric disorder of depression and chronic pain. Acute risk factors for suicide include: unemployment and social withdrawal/isolation. Protective factors for this patient include: coping skills and hope for the future. Considering these factors, the overall suicide risk at this point appears to be low. Patient is appropriate for outpatient follow up.  Neysa Hotter, MD 12/16/2017, 1:35 PM

## 2017-12-16 ENCOUNTER — Ambulatory Visit (INDEPENDENT_AMBULATORY_CARE_PROVIDER_SITE_OTHER): Payer: BLUE CROSS/BLUE SHIELD | Admitting: Psychiatry

## 2017-12-16 VITALS — BP 125/85 | HR 88 | Ht 66.0 in | Wt 260.0 lb

## 2017-12-16 DIAGNOSIS — F331 Major depressive disorder, recurrent, moderate: Secondary | ICD-10-CM

## 2017-12-16 MED ORDER — SERTRALINE HCL 50 MG PO TABS: ORAL_TABLET | ORAL | 0 refills | Status: AC

## 2017-12-16 MED ORDER — BUPROPION HCL ER (XL) 150 MG PO TB24: 450.0000 mg | ORAL_TABLET | Freq: Every day | ORAL | 0 refills | Status: AC

## 2017-12-16 NOTE — Patient Instructions (Signed)
1. Start sertraline 25 mg daily for one week, then 50 mg daily 2. Continue Wellbutrin 450 mg daily  3. Return to clinic in one month for 15 mins

## 2018-01-03 ENCOUNTER — Ambulatory Visit (HOSPITAL_COMMUNITY): Payer: Self-pay | Admitting: Psychiatry

## 2018-01-16 ENCOUNTER — Ambulatory Visit (HOSPITAL_COMMUNITY): Payer: Self-pay | Admitting: Psychiatry

## 2018-01-22 ENCOUNTER — Ambulatory Visit (HOSPITAL_COMMUNITY): Payer: Self-pay | Admitting: Psychiatry

## 2018-02-05 ENCOUNTER — Ambulatory Visit (HOSPITAL_COMMUNITY): Payer: Self-pay | Admitting: Psychiatry

## 2018-02-19 ENCOUNTER — Ambulatory Visit (HOSPITAL_COMMUNITY): Payer: Self-pay | Admitting: Psychiatry

## 2018-03-05 ENCOUNTER — Ambulatory Visit (HOSPITAL_COMMUNITY): Payer: Self-pay | Admitting: Psychiatry

## 2018-03-24 ENCOUNTER — Ambulatory Visit (HOSPITAL_COMMUNITY): Payer: Self-pay | Admitting: Psychiatry
